# Patient Record
Sex: Female | Born: 1960 | Race: White | Hispanic: No | Marital: Married | Smoking: Never smoker
Health system: Southern US, Community
[De-identification: ages and names within clinical notes are randomized; demographics above are authoritative.]

## PROBLEM LIST (undated history)

## (undated) DIAGNOSIS — J45909 Unspecified asthma, uncomplicated: Secondary | ICD-10-CM

## (undated) DIAGNOSIS — E119 Type 2 diabetes mellitus without complications: Secondary | ICD-10-CM

---

## 1998-01-12 ENCOUNTER — Ambulatory Visit (HOSPITAL_BASED_OUTPATIENT_CLINIC_OR_DEPARTMENT_OTHER): Admission: RE | Admit: 1998-01-12 | Discharge: 1998-01-12 | Payer: Self-pay | Admitting: Orthopedic Surgery

## 1998-02-11 ENCOUNTER — Ambulatory Visit: Admission: RE | Admit: 1998-02-11 | Discharge: 1998-02-11 | Payer: Self-pay | Admitting: *Deleted

## 1998-02-21 ENCOUNTER — Other Ambulatory Visit: Admission: RE | Admit: 1998-02-21 | Discharge: 1998-02-21 | Payer: Self-pay | Admitting: Obstetrics and Gynecology

## 1998-04-26 ENCOUNTER — Other Ambulatory Visit: Admission: RE | Admit: 1998-04-26 | Discharge: 1998-04-26 | Payer: Self-pay | Admitting: Obstetrics and Gynecology

## 1998-09-18 ENCOUNTER — Other Ambulatory Visit: Admission: RE | Admit: 1998-09-18 | Discharge: 1998-09-18 | Payer: Self-pay | Admitting: Obstetrics and Gynecology

## 1998-09-25 ENCOUNTER — Observation Stay (HOSPITAL_COMMUNITY): Admission: RE | Admit: 1998-09-25 | Discharge: 1998-09-26 | Payer: Self-pay | Admitting: Obstetrics and Gynecology

## 1999-03-30 ENCOUNTER — Ambulatory Visit (HOSPITAL_COMMUNITY): Admission: RE | Admit: 1999-03-30 | Discharge: 1999-03-30 | Payer: Self-pay | Admitting: Obstetrics and Gynecology

## 1999-03-30 ENCOUNTER — Encounter: Payer: Self-pay | Admitting: Obstetrics and Gynecology

## 1999-08-02 ENCOUNTER — Emergency Department (HOSPITAL_COMMUNITY): Admission: EM | Admit: 1999-08-02 | Discharge: 1999-08-02 | Payer: Self-pay | Admitting: Emergency Medicine

## 1999-08-10 ENCOUNTER — Encounter: Payer: Self-pay | Admitting: Neurosurgery

## 1999-08-10 ENCOUNTER — Encounter: Admission: RE | Admit: 1999-08-10 | Discharge: 1999-08-10 | Payer: Self-pay | Admitting: Neurosurgery

## 1999-08-21 ENCOUNTER — Other Ambulatory Visit: Admission: RE | Admit: 1999-08-21 | Discharge: 1999-08-21 | Payer: Self-pay | Admitting: Obstetrics and Gynecology

## 1999-08-29 ENCOUNTER — Encounter: Admission: RE | Admit: 1999-08-29 | Discharge: 1999-11-13 | Payer: Self-pay | Admitting: Orthopedic Surgery

## 2000-01-10 ENCOUNTER — Encounter: Payer: Self-pay | Admitting: Rheumatology

## 2000-01-10 ENCOUNTER — Ambulatory Visit (HOSPITAL_COMMUNITY): Admission: RE | Admit: 2000-01-10 | Discharge: 2000-01-10 | Payer: Self-pay | Admitting: Rheumatology

## 2000-01-16 ENCOUNTER — Encounter: Admission: RE | Admit: 2000-01-16 | Discharge: 2000-01-16 | Payer: Self-pay | Admitting: Infectious Diseases

## 2000-01-16 ENCOUNTER — Encounter: Payer: Self-pay | Admitting: Internal Medicine

## 2000-01-16 ENCOUNTER — Ambulatory Visit (HOSPITAL_COMMUNITY): Admission: RE | Admit: 2000-01-16 | Discharge: 2000-01-16 | Payer: Self-pay | Admitting: Internal Medicine

## 2000-01-30 ENCOUNTER — Encounter: Admission: RE | Admit: 2000-01-30 | Discharge: 2000-01-30 | Payer: Self-pay | Admitting: Infectious Diseases

## 2000-01-31 ENCOUNTER — Encounter: Payer: Self-pay | Admitting: Infectious Diseases

## 2000-01-31 ENCOUNTER — Ambulatory Visit (HOSPITAL_COMMUNITY): Admission: RE | Admit: 2000-01-31 | Discharge: 2000-01-31 | Payer: Self-pay | Admitting: Infectious Diseases

## 2000-02-27 ENCOUNTER — Encounter: Payer: Self-pay | Admitting: Gastroenterology

## 2000-02-27 ENCOUNTER — Encounter (INDEPENDENT_AMBULATORY_CARE_PROVIDER_SITE_OTHER): Payer: Self-pay | Admitting: Specialist

## 2000-02-27 ENCOUNTER — Ambulatory Visit (HOSPITAL_COMMUNITY): Admission: RE | Admit: 2000-02-27 | Discharge: 2000-02-27 | Payer: Self-pay | Admitting: Gastroenterology

## 2000-05-06 ENCOUNTER — Encounter: Payer: Self-pay | Admitting: Orthopedic Surgery

## 2000-05-06 ENCOUNTER — Encounter: Admission: RE | Admit: 2000-05-06 | Discharge: 2000-05-06 | Payer: Self-pay | Admitting: Orthopedic Surgery

## 2000-06-25 ENCOUNTER — Emergency Department (HOSPITAL_COMMUNITY): Admission: EM | Admit: 2000-06-25 | Discharge: 2000-06-25 | Payer: Self-pay | Admitting: Emergency Medicine

## 2000-09-22 ENCOUNTER — Other Ambulatory Visit: Admission: RE | Admit: 2000-09-22 | Discharge: 2000-09-22 | Payer: Self-pay | Admitting: Obstetrics and Gynecology

## 2000-10-22 ENCOUNTER — Ambulatory Visit (HOSPITAL_COMMUNITY): Admission: RE | Admit: 2000-10-22 | Discharge: 2000-10-22 | Payer: Self-pay | Admitting: Orthopedic Surgery

## 2000-10-22 ENCOUNTER — Encounter: Payer: Self-pay | Admitting: Orthopedic Surgery

## 2000-10-31 ENCOUNTER — Encounter: Payer: Self-pay | Admitting: Orthopedic Surgery

## 2000-10-31 ENCOUNTER — Ambulatory Visit (HOSPITAL_COMMUNITY): Admission: RE | Admit: 2000-10-31 | Discharge: 2000-10-31 | Payer: Self-pay | Admitting: Orthopedic Surgery

## 2000-11-12 ENCOUNTER — Encounter (INDEPENDENT_AMBULATORY_CARE_PROVIDER_SITE_OTHER): Payer: Self-pay | Admitting: *Deleted

## 2000-11-12 ENCOUNTER — Ambulatory Visit (HOSPITAL_BASED_OUTPATIENT_CLINIC_OR_DEPARTMENT_OTHER): Admission: RE | Admit: 2000-11-12 | Discharge: 2000-11-12 | Payer: Self-pay | Admitting: *Deleted

## 2000-11-19 ENCOUNTER — Ambulatory Visit (HOSPITAL_COMMUNITY): Admission: RE | Admit: 2000-11-19 | Discharge: 2000-11-19 | Payer: Self-pay | Admitting: Gastroenterology

## 2000-11-19 ENCOUNTER — Encounter: Payer: Self-pay | Admitting: Gastroenterology

## 2000-11-27 ENCOUNTER — Encounter: Admission: RE | Admit: 2000-11-27 | Discharge: 2000-11-27 | Payer: Self-pay | Admitting: Infectious Diseases

## 2000-12-11 ENCOUNTER — Encounter (INDEPENDENT_AMBULATORY_CARE_PROVIDER_SITE_OTHER): Payer: Self-pay | Admitting: Specialist

## 2000-12-11 ENCOUNTER — Observation Stay (HOSPITAL_COMMUNITY): Admission: RE | Admit: 2000-12-11 | Discharge: 2000-12-13 | Payer: Self-pay | Admitting: *Deleted

## 2001-03-08 ENCOUNTER — Emergency Department (HOSPITAL_COMMUNITY): Admission: EM | Admit: 2001-03-08 | Discharge: 2001-03-08 | Payer: Self-pay | Admitting: Emergency Medicine

## 2001-03-23 ENCOUNTER — Encounter: Admission: RE | Admit: 2001-03-23 | Discharge: 2001-03-23 | Payer: Self-pay | Admitting: Orthopedic Surgery

## 2001-03-23 ENCOUNTER — Encounter: Payer: Self-pay | Admitting: Orthopedic Surgery

## 2001-03-26 ENCOUNTER — Encounter: Admission: RE | Admit: 2001-03-26 | Discharge: 2001-03-26 | Payer: Self-pay | Admitting: Orthopedic Surgery

## 2001-03-26 ENCOUNTER — Encounter: Payer: Self-pay | Admitting: Orthopedic Surgery

## 2001-05-04 ENCOUNTER — Encounter: Admission: RE | Admit: 2001-05-04 | Discharge: 2001-05-04 | Payer: Self-pay | Admitting: Orthopedic Surgery

## 2001-05-04 ENCOUNTER — Encounter: Payer: Self-pay | Admitting: Orthopedic Surgery

## 2001-05-05 ENCOUNTER — Ambulatory Visit (HOSPITAL_COMMUNITY): Admission: RE | Admit: 2001-05-05 | Discharge: 2001-05-05 | Payer: Self-pay | Admitting: Gastroenterology

## 2001-06-10 ENCOUNTER — Ambulatory Visit: Admission: RE | Admit: 2001-06-10 | Discharge: 2001-06-10 | Payer: Self-pay | Admitting: Neurosurgery

## 2001-06-10 ENCOUNTER — Encounter: Payer: Self-pay | Admitting: Neurosurgery

## 2001-06-12 ENCOUNTER — Encounter: Payer: Self-pay | Admitting: Neurosurgery

## 2001-06-12 ENCOUNTER — Encounter: Admission: RE | Admit: 2001-06-12 | Discharge: 2001-06-12 | Payer: Self-pay | Admitting: Neurosurgery

## 2001-06-17 ENCOUNTER — Inpatient Hospital Stay (HOSPITAL_COMMUNITY): Admission: RE | Admit: 2001-06-17 | Discharge: 2001-06-20 | Payer: Self-pay | Admitting: Neurosurgery

## 2001-06-17 ENCOUNTER — Encounter: Payer: Self-pay | Admitting: Neurosurgery

## 2001-07-13 ENCOUNTER — Encounter: Admission: RE | Admit: 2001-07-13 | Discharge: 2001-07-13 | Payer: Self-pay | Admitting: Neurosurgery

## 2001-07-13 ENCOUNTER — Encounter: Payer: Self-pay | Admitting: Neurosurgery

## 2001-07-22 ENCOUNTER — Other Ambulatory Visit: Admission: RE | Admit: 2001-07-22 | Discharge: 2001-07-22 | Payer: Self-pay | Admitting: Obstetrics and Gynecology

## 2001-08-06 ENCOUNTER — Encounter: Admission: RE | Admit: 2001-08-06 | Discharge: 2001-08-06 | Payer: Self-pay | Admitting: Infectious Diseases

## 2001-08-19 ENCOUNTER — Encounter: Admission: RE | Admit: 2001-08-19 | Discharge: 2001-08-19 | Payer: Self-pay | Admitting: Neurosurgery

## 2001-08-19 ENCOUNTER — Encounter: Payer: Self-pay | Admitting: Neurosurgery

## 2001-08-21 ENCOUNTER — Encounter: Admission: RE | Admit: 2001-08-21 | Discharge: 2001-08-21 | Payer: Self-pay | Admitting: Infectious Diseases

## 2001-09-16 ENCOUNTER — Encounter: Admission: RE | Admit: 2001-09-16 | Discharge: 2001-09-16 | Payer: Self-pay | Admitting: Internal Medicine

## 2001-09-18 ENCOUNTER — Encounter: Admission: RE | Admit: 2001-09-18 | Discharge: 2001-09-18 | Payer: Self-pay | Admitting: Infectious Diseases

## 2001-09-27 ENCOUNTER — Encounter: Admission: RE | Admit: 2001-09-27 | Discharge: 2001-09-27 | Payer: Self-pay | Admitting: Orthopaedic Surgery

## 2001-09-27 ENCOUNTER — Encounter: Payer: Self-pay | Admitting: Orthopaedic Surgery

## 2001-10-12 ENCOUNTER — Encounter: Payer: Self-pay | Admitting: Orthopedic Surgery

## 2001-10-12 ENCOUNTER — Encounter: Admission: RE | Admit: 2001-10-12 | Discharge: 2001-10-12 | Payer: Self-pay | Admitting: Orthopedic Surgery

## 2001-11-20 ENCOUNTER — Encounter: Payer: Self-pay | Admitting: Orthopedic Surgery

## 2001-11-20 ENCOUNTER — Encounter: Admission: RE | Admit: 2001-11-20 | Discharge: 2001-11-20 | Payer: Self-pay | Admitting: Orthopedic Surgery

## 2001-12-08 ENCOUNTER — Encounter: Payer: Self-pay | Admitting: Orthopedic Surgery

## 2001-12-08 ENCOUNTER — Encounter: Admission: RE | Admit: 2001-12-08 | Discharge: 2001-12-08 | Payer: Self-pay | Admitting: Orthopedic Surgery

## 2001-12-22 ENCOUNTER — Encounter: Admission: RE | Admit: 2001-12-22 | Discharge: 2001-12-22 | Payer: Self-pay | Admitting: Orthopedic Surgery

## 2001-12-22 ENCOUNTER — Encounter: Payer: Self-pay | Admitting: Orthopedic Surgery

## 2002-01-05 ENCOUNTER — Encounter: Payer: Self-pay | Admitting: Orthopedic Surgery

## 2002-01-05 ENCOUNTER — Encounter: Admission: RE | Admit: 2002-01-05 | Discharge: 2002-01-05 | Payer: Self-pay | Admitting: Orthopedic Surgery

## 2002-01-18 ENCOUNTER — Encounter: Payer: Self-pay | Admitting: Emergency Medicine

## 2002-01-18 ENCOUNTER — Emergency Department (HOSPITAL_COMMUNITY): Admission: EM | Admit: 2002-01-18 | Discharge: 2002-01-18 | Payer: Self-pay | Admitting: Emergency Medicine

## 2002-04-07 ENCOUNTER — Encounter: Payer: Self-pay | Admitting: Obstetrics and Gynecology

## 2002-04-07 ENCOUNTER — Encounter: Admission: RE | Admit: 2002-04-07 | Discharge: 2002-04-07 | Payer: Self-pay | Admitting: Obstetrics and Gynecology

## 2002-05-26 ENCOUNTER — Encounter: Admission: RE | Admit: 2002-05-26 | Discharge: 2002-05-26 | Payer: Self-pay | Admitting: Orthopedic Surgery

## 2002-05-26 ENCOUNTER — Encounter: Payer: Self-pay | Admitting: Orthopedic Surgery

## 2002-06-15 ENCOUNTER — Encounter: Admission: RE | Admit: 2002-06-15 | Discharge: 2002-06-15 | Payer: Self-pay | Admitting: Internal Medicine

## 2002-06-15 ENCOUNTER — Encounter: Payer: Self-pay | Admitting: Internal Medicine

## 2002-07-09 ENCOUNTER — Encounter: Payer: Self-pay | Admitting: Orthopedic Surgery

## 2002-07-09 ENCOUNTER — Encounter: Admission: RE | Admit: 2002-07-09 | Discharge: 2002-07-09 | Payer: Self-pay | Admitting: Orthopedic Surgery

## 2002-07-23 ENCOUNTER — Encounter: Admission: RE | Admit: 2002-07-23 | Discharge: 2002-07-23 | Payer: Self-pay | Admitting: Orthopedic Surgery

## 2002-07-23 ENCOUNTER — Encounter: Payer: Self-pay | Admitting: Orthopedic Surgery

## 2002-08-06 ENCOUNTER — Encounter: Admission: RE | Admit: 2002-08-06 | Discharge: 2002-08-06 | Payer: Self-pay | Admitting: Orthopedic Surgery

## 2002-08-06 ENCOUNTER — Encounter: Payer: Self-pay | Admitting: Orthopedic Surgery

## 2002-08-11 ENCOUNTER — Other Ambulatory Visit: Admission: RE | Admit: 2002-08-11 | Discharge: 2002-08-11 | Payer: Self-pay | Admitting: Obstetrics and Gynecology

## 2002-09-09 ENCOUNTER — Encounter: Payer: Self-pay | Admitting: *Deleted

## 2002-09-09 ENCOUNTER — Encounter: Admission: RE | Admit: 2002-09-09 | Discharge: 2002-09-09 | Payer: Self-pay | Admitting: *Deleted

## 2002-09-16 ENCOUNTER — Encounter: Payer: Self-pay | Admitting: Internal Medicine

## 2002-09-16 DIAGNOSIS — D126 Benign neoplasm of colon, unspecified: Secondary | ICD-10-CM

## 2002-09-16 DIAGNOSIS — K644 Residual hemorrhoidal skin tags: Secondary | ICD-10-CM | POA: Insufficient documentation

## 2002-09-18 ENCOUNTER — Encounter: Admission: RE | Admit: 2002-09-18 | Discharge: 2002-09-18 | Payer: Self-pay | Admitting: Orthopedic Surgery

## 2002-09-18 ENCOUNTER — Encounter: Payer: Self-pay | Admitting: Orthopedic Surgery

## 2002-11-24 ENCOUNTER — Encounter: Payer: Self-pay | Admitting: Family Medicine

## 2002-11-24 ENCOUNTER — Encounter: Admission: RE | Admit: 2002-11-24 | Discharge: 2002-11-24 | Payer: Self-pay | Admitting: Family Medicine

## 2002-12-15 ENCOUNTER — Encounter: Payer: Self-pay | Admitting: Neurosurgery

## 2002-12-15 ENCOUNTER — Ambulatory Visit (HOSPITAL_COMMUNITY): Admission: RE | Admit: 2002-12-15 | Discharge: 2002-12-17 | Payer: Self-pay | Admitting: Neurosurgery

## 2003-02-04 ENCOUNTER — Ambulatory Visit (HOSPITAL_BASED_OUTPATIENT_CLINIC_OR_DEPARTMENT_OTHER): Admission: RE | Admit: 2003-02-04 | Discharge: 2003-02-04 | Payer: Self-pay | Admitting: Urology

## 2003-06-09 ENCOUNTER — Ambulatory Visit (HOSPITAL_BASED_OUTPATIENT_CLINIC_OR_DEPARTMENT_OTHER): Admission: RE | Admit: 2003-06-09 | Discharge: 2003-06-09 | Payer: Self-pay | Admitting: Urology

## 2003-06-29 ENCOUNTER — Encounter: Admission: RE | Admit: 2003-06-29 | Discharge: 2003-06-29 | Payer: Self-pay | Admitting: Obstetrics and Gynecology

## 2003-06-29 ENCOUNTER — Encounter: Payer: Self-pay | Admitting: Obstetrics and Gynecology

## 2003-08-15 ENCOUNTER — Other Ambulatory Visit: Admission: RE | Admit: 2003-08-15 | Discharge: 2003-08-15 | Payer: Self-pay | Admitting: Obstetrics and Gynecology

## 2004-01-20 ENCOUNTER — Encounter: Admission: RE | Admit: 2004-01-20 | Discharge: 2004-01-20 | Payer: Self-pay | Admitting: Orthopedic Surgery

## 2004-05-11 ENCOUNTER — Encounter: Admission: RE | Admit: 2004-05-11 | Discharge: 2004-05-11 | Payer: Self-pay | Admitting: Obstetrics and Gynecology

## 2004-07-06 ENCOUNTER — Encounter: Admission: RE | Admit: 2004-07-06 | Discharge: 2004-07-06 | Payer: Self-pay | Admitting: Orthopedic Surgery

## 2004-07-27 ENCOUNTER — Inpatient Hospital Stay (HOSPITAL_COMMUNITY): Admission: RE | Admit: 2004-07-27 | Discharge: 2004-08-02 | Payer: Self-pay | Admitting: Neurosurgery

## 2004-08-18 ENCOUNTER — Emergency Department (HOSPITAL_COMMUNITY): Admission: EM | Admit: 2004-08-18 | Discharge: 2004-08-18 | Payer: Self-pay | Admitting: Emergency Medicine

## 2004-10-04 ENCOUNTER — Ambulatory Visit: Payer: Self-pay | Admitting: Pulmonary Disease

## 2004-11-20 ENCOUNTER — Ambulatory Visit: Payer: Self-pay | Admitting: Family Medicine

## 2004-12-14 ENCOUNTER — Encounter: Payer: Self-pay | Admitting: Pulmonary Disease

## 2005-01-14 ENCOUNTER — Encounter: Admission: RE | Admit: 2005-01-14 | Discharge: 2005-01-24 | Payer: Self-pay | Admitting: Orthopedic Surgery

## 2005-01-14 ENCOUNTER — Ambulatory Visit: Payer: Self-pay | Admitting: Internal Medicine

## 2005-01-25 ENCOUNTER — Ambulatory Visit: Payer: Self-pay | Admitting: Family Medicine

## 2005-02-05 ENCOUNTER — Ambulatory Visit: Payer: Self-pay | Admitting: Cardiology

## 2005-02-07 ENCOUNTER — Ambulatory Visit: Payer: Self-pay | Admitting: Pulmonary Disease

## 2005-02-08 ENCOUNTER — Ambulatory Visit: Payer: Self-pay | Admitting: Cardiovascular Disease

## 2005-02-14 ENCOUNTER — Ambulatory Visit: Payer: Self-pay | Admitting: Cardiovascular Disease

## 2005-02-22 ENCOUNTER — Ambulatory Visit (HOSPITAL_COMMUNITY): Admission: RE | Admit: 2005-02-22 | Discharge: 2005-02-22 | Payer: Self-pay | Admitting: Neurosurgery

## 2005-04-11 ENCOUNTER — Ambulatory Visit: Payer: Self-pay | Admitting: Pulmonary Disease

## 2005-07-11 ENCOUNTER — Ambulatory Visit: Payer: Self-pay | Admitting: Internal Medicine

## 2005-07-11 ENCOUNTER — Other Ambulatory Visit: Admission: RE | Admit: 2005-07-11 | Discharge: 2005-07-11 | Payer: Self-pay | Admitting: Obstetrics and Gynecology

## 2005-08-16 IMAGING — RF DG LUMBAR SPINE 2-3V
1 series · 2 of 2 positions shown · non-contrast
Comparison: none

CLINICAL DATA: L5-S1 diskectomy and fusion.
 LATERAL LUMBAR SPINE 
 Only a single C-arm spot film of the lumbar spine was returned for interpretation from the operating room in the lateral projection.  Transpedicular screws are present for fixation but resolution is very limited.  Follow-up films are recommended.

[Series 1: run · 2 of 2 slices shown]
[im 1/2]
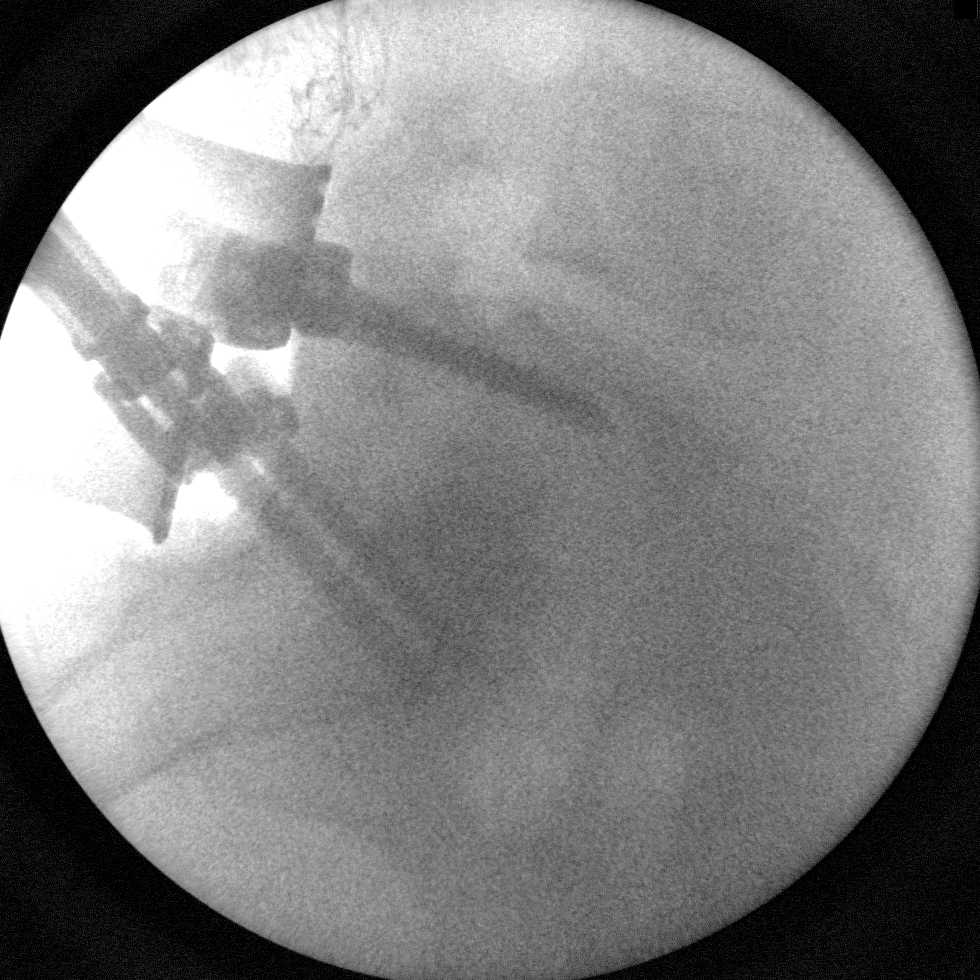
[im 2/2]
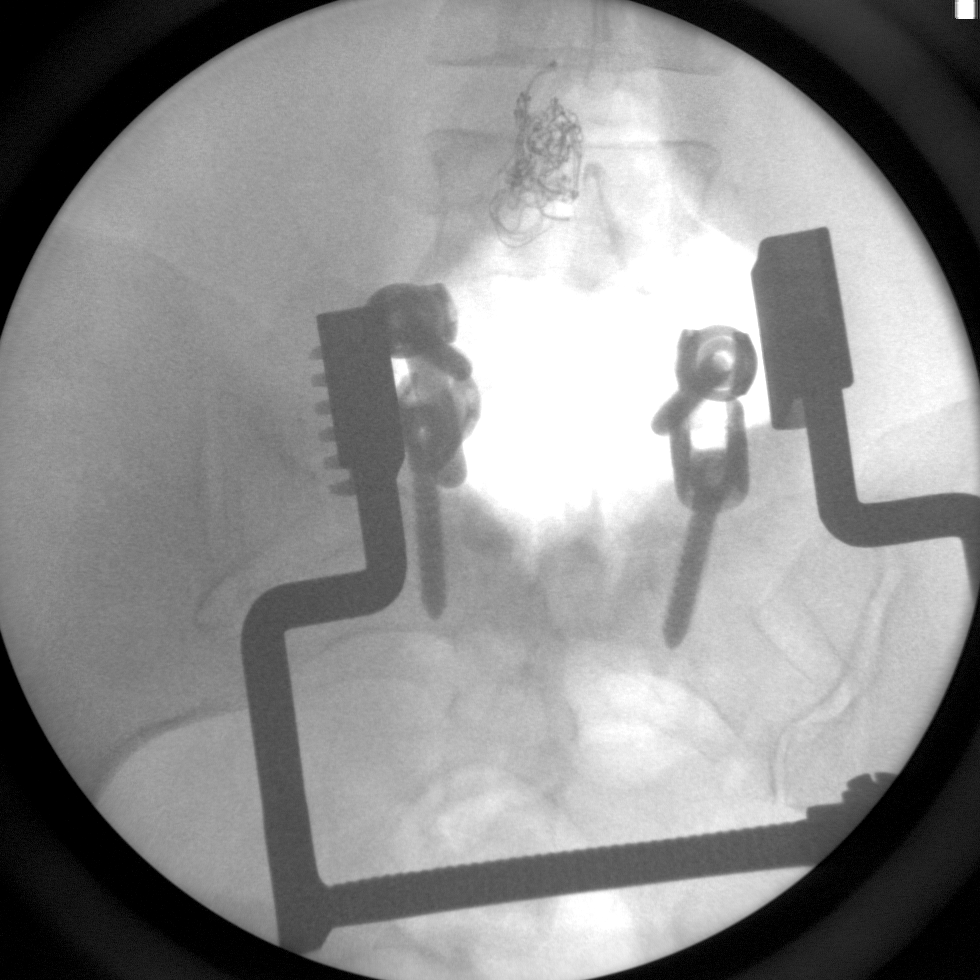

[2 of 2 positions shown; findings below may reference images not displayed]

IMPRESSION: Transpedicular screws are present for apparent L5-S1 fusion.  Suggest follow-up films with diminished resolution on the present film.

## 2005-12-04 ENCOUNTER — Ambulatory Visit: Payer: Self-pay | Admitting: Internal Medicine

## 2005-12-23 ENCOUNTER — Ambulatory Visit: Payer: Self-pay | Admitting: Pulmonary Disease

## 2005-12-30 ENCOUNTER — Ambulatory Visit: Payer: Self-pay | Admitting: Pulmonary Disease

## 2006-03-17 ENCOUNTER — Encounter: Admission: RE | Admit: 2006-03-17 | Discharge: 2006-03-17 | Payer: Self-pay | Admitting: Orthopedic Surgery

## 2006-03-18 ENCOUNTER — Ambulatory Visit: Payer: Self-pay | Admitting: Internal Medicine

## 2006-04-01 ENCOUNTER — Ambulatory Visit (HOSPITAL_COMMUNITY): Admission: RE | Admit: 2006-04-01 | Discharge: 2006-04-01 | Payer: Self-pay | Admitting: Internal Medicine

## 2006-04-22 ENCOUNTER — Encounter (INDEPENDENT_AMBULATORY_CARE_PROVIDER_SITE_OTHER): Payer: Self-pay | Admitting: Specialist

## 2006-04-22 ENCOUNTER — Ambulatory Visit (HOSPITAL_COMMUNITY): Admission: RE | Admit: 2006-04-22 | Discharge: 2006-04-22 | Payer: Self-pay | Admitting: Internal Medicine

## 2006-04-22 ENCOUNTER — Encounter: Payer: Self-pay | Admitting: Internal Medicine

## 2006-04-22 DIAGNOSIS — K2990 Gastroduodenitis, unspecified, without bleeding: Secondary | ICD-10-CM

## 2006-04-22 DIAGNOSIS — K2970 Gastritis, unspecified, without bleeding: Secondary | ICD-10-CM | POA: Insufficient documentation

## 2006-04-22 DIAGNOSIS — K449 Diaphragmatic hernia without obstruction or gangrene: Secondary | ICD-10-CM | POA: Insufficient documentation

## 2006-04-24 ENCOUNTER — Ambulatory Visit: Payer: Self-pay | Admitting: Internal Medicine

## 2006-05-02 ENCOUNTER — Ambulatory Visit: Payer: Self-pay | Admitting: Pulmonary Disease

## 2006-05-19 ENCOUNTER — Ambulatory Visit: Payer: Self-pay | Admitting: Internal Medicine

## 2006-06-13 ENCOUNTER — Ambulatory Visit: Payer: Self-pay | Admitting: Cardiovascular Disease

## 2006-06-17 ENCOUNTER — Ambulatory Visit: Payer: Self-pay | Admitting: Pulmonary Disease

## 2006-07-07 ENCOUNTER — Ambulatory Visit: Payer: Self-pay | Admitting: Pulmonary Disease

## 2006-07-22 ENCOUNTER — Ambulatory Visit: Payer: Self-pay | Admitting: Pulmonary Disease

## 2006-08-18 ENCOUNTER — Ambulatory Visit: Payer: Self-pay | Admitting: Cardiovascular Disease

## 2006-08-18 ENCOUNTER — Ambulatory Visit: Payer: Self-pay | Admitting: Pulmonary Disease

## 2006-10-03 ENCOUNTER — Ambulatory Visit (HOSPITAL_COMMUNITY): Admission: RE | Admit: 2006-10-03 | Discharge: 2006-10-03 | Payer: Self-pay | Admitting: Urology

## 2007-02-23 ENCOUNTER — Ambulatory Visit: Payer: Self-pay | Admitting: Pulmonary Disease

## 2007-02-24 LAB — CONVERTED CEMR LAB
CO2: 30 meq/L (ref 19–32)
Glucose, Bld: 90 mg/dL (ref 70–99)
Potassium: 4.5 meq/L (ref 3.5–5.1)

## 2007-03-03 ENCOUNTER — Ambulatory Visit (HOSPITAL_COMMUNITY): Admission: RE | Admit: 2007-03-03 | Discharge: 2007-03-03 | Payer: Self-pay | Admitting: Pulmonary Disease

## 2007-03-10 ENCOUNTER — Ambulatory Visit: Payer: Self-pay | Admitting: Cardiovascular Disease

## 2007-04-09 ENCOUNTER — Ambulatory Visit: Payer: Self-pay | Admitting: Pulmonary Disease

## 2007-05-29 ENCOUNTER — Ambulatory Visit: Payer: Self-pay | Admitting: Cardiovascular Disease

## 2007-05-29 LAB — CONVERTED CEMR LAB
BUN: 3 mg/dL — ABNORMAL LOW (ref 6–23)
CO2: 29 meq/L (ref 19–32)
Calcium: 9.4 mg/dL (ref 8.4–10.5)
Chloride: 101 meq/L (ref 96–112)
Creatinine, Ser: 0.7 mg/dL (ref 0.4–1.2)
GFR calc Af Amer: 116 mL/min
GFR calc non Af Amer: 96 mL/min
Glucose, Bld: 107 mg/dL — ABNORMAL HIGH (ref 70–99)
Potassium: 3.4 meq/L — ABNORMAL LOW (ref 3.5–5.1)
Sodium: 139 meq/L (ref 135–145)

## 2007-06-15 ENCOUNTER — Ambulatory Visit (HOSPITAL_COMMUNITY): Admission: RE | Admit: 2007-06-15 | Discharge: 2007-06-15 | Payer: Self-pay | Admitting: Cardiovascular Disease

## 2007-06-22 ENCOUNTER — Ambulatory Visit: Payer: Self-pay | Admitting: Cardiovascular Disease

## 2007-06-22 ENCOUNTER — Encounter: Payer: Self-pay | Admitting: Cardiovascular Disease

## 2007-06-22 LAB — CONVERTED CEMR LAB
ALT: 26 units/L (ref 0–35)
AST: 26 units/L (ref 0–37)
Albumin: 3.2 g/dL — ABNORMAL LOW (ref 3.5–5.2)
Alkaline Phosphatase: 98 units/L (ref 39–117)
Basophils Relative: 0 % (ref 0.0–1.0)
Free T4: 0.4 ng/dL — ABNORMAL LOW (ref 0.6–1.6)
Monocytes Relative: 2.8 % — ABNORMAL LOW (ref 3.0–11.0)
Platelets: 300 10*3/uL (ref 150–400)
RBC: 4.15 M/uL (ref 3.87–5.11)
RDW: 13.6 % (ref 11.5–14.6)
Total Bilirubin: 0.5 mg/dL (ref 0.3–1.2)

## 2007-06-29 ENCOUNTER — Ambulatory Visit: Payer: Self-pay | Admitting: Cardiovascular Disease

## 2007-07-01 ENCOUNTER — Ambulatory Visit: Payer: Self-pay | Admitting: Pulmonary Disease

## 2007-07-17 ENCOUNTER — Emergency Department (HOSPITAL_COMMUNITY): Admission: EM | Admit: 2007-07-17 | Discharge: 2007-07-17 | Payer: Self-pay | Admitting: Emergency Medicine

## 2007-07-21 DIAGNOSIS — K219 Gastro-esophageal reflux disease without esophagitis: Secondary | ICD-10-CM

## 2007-07-21 DIAGNOSIS — R059 Cough, unspecified: Secondary | ICD-10-CM | POA: Insufficient documentation

## 2007-07-21 DIAGNOSIS — E7800 Pure hypercholesterolemia, unspecified: Secondary | ICD-10-CM

## 2007-07-21 DIAGNOSIS — G4733 Obstructive sleep apnea (adult) (pediatric): Secondary | ICD-10-CM

## 2007-07-21 DIAGNOSIS — R300 Dysuria: Secondary | ICD-10-CM | POA: Insufficient documentation

## 2007-07-21 DIAGNOSIS — R0602 Shortness of breath: Secondary | ICD-10-CM

## 2007-07-21 DIAGNOSIS — I10 Essential (primary) hypertension: Secondary | ICD-10-CM | POA: Insufficient documentation

## 2007-07-21 DIAGNOSIS — J019 Acute sinusitis, unspecified: Secondary | ICD-10-CM

## 2007-07-21 DIAGNOSIS — R609 Edema, unspecified: Secondary | ICD-10-CM

## 2007-07-21 DIAGNOSIS — R05 Cough: Secondary | ICD-10-CM

## 2007-07-21 DIAGNOSIS — R259 Unspecified abnormal involuntary movements: Secondary | ICD-10-CM | POA: Insufficient documentation

## 2007-07-25 DIAGNOSIS — J383 Other diseases of vocal cords: Secondary | ICD-10-CM

## 2007-08-24 ENCOUNTER — Telehealth: Payer: Self-pay | Admitting: Pulmonary Disease

## 2007-09-02 ENCOUNTER — Ambulatory Visit: Payer: Self-pay | Admitting: Pulmonary Disease

## 2007-09-09 ENCOUNTER — Ambulatory Visit: Payer: Self-pay | Admitting: Internal Medicine

## 2007-10-02 ENCOUNTER — Telehealth (INDEPENDENT_AMBULATORY_CARE_PROVIDER_SITE_OTHER): Payer: Self-pay | Admitting: *Deleted

## 2007-10-07 DIAGNOSIS — IMO0001 Reserved for inherently not codable concepts without codable children: Secondary | ICD-10-CM

## 2007-10-07 DIAGNOSIS — R498 Other voice and resonance disorders: Secondary | ICD-10-CM

## 2007-10-07 DIAGNOSIS — G8929 Other chronic pain: Secondary | ICD-10-CM

## 2007-10-07 DIAGNOSIS — K7689 Other specified diseases of liver: Secondary | ICD-10-CM

## 2007-10-07 DIAGNOSIS — E119 Type 2 diabetes mellitus without complications: Secondary | ICD-10-CM | POA: Insufficient documentation

## 2007-10-07 DIAGNOSIS — K589 Irritable bowel syndrome without diarrhea: Secondary | ICD-10-CM

## 2007-10-07 DIAGNOSIS — F329 Major depressive disorder, single episode, unspecified: Secondary | ICD-10-CM

## 2007-10-07 DIAGNOSIS — K439 Ventral hernia without obstruction or gangrene: Secondary | ICD-10-CM | POA: Insufficient documentation

## 2007-10-07 DIAGNOSIS — R32 Unspecified urinary incontinence: Secondary | ICD-10-CM

## 2007-10-07 DIAGNOSIS — R002 Palpitations: Secondary | ICD-10-CM

## 2007-10-07 DIAGNOSIS — R0789 Other chest pain: Secondary | ICD-10-CM | POA: Insufficient documentation

## 2007-10-07 DIAGNOSIS — F3289 Other specified depressive episodes: Secondary | ICD-10-CM | POA: Insufficient documentation

## 2007-10-07 DIAGNOSIS — R1314 Dysphagia, pharyngoesophageal phase: Secondary | ICD-10-CM

## 2007-10-07 DIAGNOSIS — G43909 Migraine, unspecified, not intractable, without status migrainosus: Secondary | ICD-10-CM | POA: Insufficient documentation

## 2007-10-28 ENCOUNTER — Telehealth: Payer: Self-pay | Admitting: Pulmonary Disease

## 2007-10-29 ENCOUNTER — Telehealth (INDEPENDENT_AMBULATORY_CARE_PROVIDER_SITE_OTHER): Payer: Self-pay | Admitting: *Deleted

## 2007-11-01 ENCOUNTER — Encounter: Admission: RE | Admit: 2007-11-01 | Discharge: 2007-11-01 | Payer: Self-pay | Admitting: Orthopedic Surgery

## 2007-11-20 ENCOUNTER — Ambulatory Visit: Payer: Self-pay | Admitting: Licensed Clinical Social Worker

## 2007-11-27 ENCOUNTER — Ambulatory Visit: Payer: Self-pay | Admitting: Licensed Clinical Social Worker

## 2007-12-09 ENCOUNTER — Inpatient Hospital Stay (HOSPITAL_COMMUNITY): Admission: AD | Admit: 2007-12-09 | Discharge: 2007-12-10 | Payer: Self-pay | Admitting: Orthopaedic Surgery

## 2007-12-31 ENCOUNTER — Encounter: Admission: RE | Admit: 2007-12-31 | Discharge: 2008-03-10 | Payer: Self-pay | Admitting: Orthopaedic Surgery

## 2008-01-01 ENCOUNTER — Ambulatory Visit: Payer: Self-pay | Admitting: Licensed Clinical Social Worker

## 2008-01-01 ENCOUNTER — Encounter: Payer: Self-pay | Admitting: Pulmonary Disease

## 2008-01-10 ENCOUNTER — Encounter: Admission: RE | Admit: 2008-01-10 | Discharge: 2008-01-10 | Payer: Self-pay | Admitting: Orthopaedic Surgery

## 2008-01-23 ENCOUNTER — Encounter: Admission: RE | Admit: 2008-01-23 | Discharge: 2008-01-23 | Payer: Self-pay | Admitting: Neurosurgery

## 2008-01-26 ENCOUNTER — Encounter: Admission: RE | Admit: 2008-01-26 | Discharge: 2008-01-26 | Payer: Self-pay | Admitting: Neurosurgery

## 2008-02-10 ENCOUNTER — Inpatient Hospital Stay (HOSPITAL_COMMUNITY): Admission: RE | Admit: 2008-02-10 | Discharge: 2008-02-11 | Payer: Self-pay | Admitting: Orthopaedic Surgery

## 2008-03-25 ENCOUNTER — Telehealth: Payer: Self-pay | Admitting: Pulmonary Disease

## 2008-03-25 ENCOUNTER — Encounter: Payer: Self-pay | Admitting: Pulmonary Disease

## 2008-05-31 ENCOUNTER — Telehealth: Payer: Self-pay | Admitting: Internal Medicine

## 2008-06-01 ENCOUNTER — Encounter: Admission: RE | Admit: 2008-06-01 | Discharge: 2008-06-01 | Payer: Self-pay | Admitting: Obstetrics and Gynecology

## 2008-06-01 ENCOUNTER — Ambulatory Visit: Payer: Self-pay | Admitting: Cardiovascular Disease

## 2008-06-06 ENCOUNTER — Encounter: Admission: RE | Admit: 2008-06-06 | Discharge: 2008-06-06 | Payer: Self-pay | Admitting: Obstetrics and Gynecology

## 2008-06-07 ENCOUNTER — Ambulatory Visit: Payer: Self-pay | Admitting: Pulmonary Disease

## 2008-06-07 ENCOUNTER — Telehealth (INDEPENDENT_AMBULATORY_CARE_PROVIDER_SITE_OTHER): Payer: Self-pay | Admitting: *Deleted

## 2008-06-29 ENCOUNTER — Encounter
Admission: RE | Admit: 2008-06-29 | Discharge: 2008-09-27 | Payer: Self-pay | Admitting: Physical Medicine and Rehabilitation

## 2008-07-01 ENCOUNTER — Ambulatory Visit: Payer: Self-pay | Admitting: Physical Medicine and Rehabilitation

## 2008-07-02 ENCOUNTER — Encounter: Admission: RE | Admit: 2008-07-02 | Discharge: 2008-07-02 | Payer: Self-pay | Admitting: Orthopedic Surgery

## 2008-07-04 ENCOUNTER — Encounter: Admission: RE | Admit: 2008-07-04 | Discharge: 2008-07-04 | Payer: Self-pay | Admitting: General Surgery

## 2008-07-04 ENCOUNTER — Ambulatory Visit (HOSPITAL_COMMUNITY): Admission: RE | Admit: 2008-07-04 | Discharge: 2008-07-04 | Payer: Self-pay | Admitting: General Surgery

## 2008-07-04 ENCOUNTER — Encounter (INDEPENDENT_AMBULATORY_CARE_PROVIDER_SITE_OTHER): Payer: Self-pay | Admitting: General Surgery

## 2008-07-28 ENCOUNTER — Ambulatory Visit: Payer: Self-pay | Admitting: Physical Medicine & Rehabilitation

## 2008-07-28 ENCOUNTER — Encounter
Admission: RE | Admit: 2008-07-28 | Discharge: 2008-09-07 | Payer: Self-pay | Admitting: Physical Medicine & Rehabilitation

## 2008-07-28 ENCOUNTER — Encounter
Admission: RE | Admit: 2008-07-28 | Discharge: 2008-07-28 | Payer: Self-pay | Admitting: Physical Medicine & Rehabilitation

## 2008-07-29 ENCOUNTER — Ambulatory Visit: Payer: Self-pay | Admitting: Physical Medicine and Rehabilitation

## 2008-07-30 ENCOUNTER — Emergency Department (HOSPITAL_COMMUNITY): Admission: EM | Admit: 2008-07-30 | Discharge: 2008-07-30 | Payer: Self-pay | Admitting: Emergency Medicine

## 2008-08-02 ENCOUNTER — Telehealth (INDEPENDENT_AMBULATORY_CARE_PROVIDER_SITE_OTHER): Payer: Self-pay | Admitting: *Deleted

## 2008-09-08 ENCOUNTER — Ambulatory Visit (HOSPITAL_COMMUNITY): Admission: RE | Admit: 2008-09-08 | Discharge: 2008-09-08 | Payer: Self-pay | Admitting: Orthopaedic Surgery

## 2008-11-01 ENCOUNTER — Telehealth (INDEPENDENT_AMBULATORY_CARE_PROVIDER_SITE_OTHER): Payer: Self-pay | Admitting: *Deleted

## 2008-11-03 ENCOUNTER — Encounter
Admission: RE | Admit: 2008-11-03 | Discharge: 2008-11-08 | Payer: Self-pay | Admitting: Physical Medicine and Rehabilitation

## 2008-11-04 ENCOUNTER — Ambulatory Visit: Payer: Self-pay | Admitting: Physical Medicine and Rehabilitation

## 2008-12-09 ENCOUNTER — Encounter: Admission: RE | Admit: 2008-12-09 | Discharge: 2008-12-09 | Payer: Self-pay | Admitting: Obstetrics and Gynecology

## 2009-06-30 ENCOUNTER — Encounter: Admission: RE | Admit: 2009-06-30 | Discharge: 2009-06-30 | Payer: Self-pay | Admitting: Obstetrics and Gynecology

## 2009-08-09 ENCOUNTER — Ambulatory Visit: Payer: Self-pay | Admitting: Pulmonary Disease

## 2009-08-10 DIAGNOSIS — J45909 Unspecified asthma, uncomplicated: Secondary | ICD-10-CM | POA: Insufficient documentation

## 2009-08-12 ENCOUNTER — Encounter: Admission: RE | Admit: 2009-08-12 | Discharge: 2009-08-12 | Payer: Self-pay | Admitting: Orthopedic Surgery

## 2009-10-09 ENCOUNTER — Encounter: Admission: RE | Admit: 2009-10-09 | Discharge: 2009-10-09 | Payer: Self-pay | Admitting: Neurosurgery

## 2009-12-04 ENCOUNTER — Encounter: Admission: RE | Admit: 2009-12-04 | Discharge: 2009-12-04 | Payer: Self-pay | Admitting: Neurosurgery

## 2010-03-07 ENCOUNTER — Encounter: Payer: Self-pay | Admitting: Pulmonary Disease

## 2010-04-30 ENCOUNTER — Encounter: Payer: Self-pay | Admitting: Pulmonary Disease

## 2010-08-09 ENCOUNTER — Telehealth: Payer: Self-pay | Admitting: Pulmonary Disease

## 2010-10-04 ENCOUNTER — Encounter
Admission: RE | Admit: 2010-10-04 | Discharge: 2010-10-04 | Payer: Self-pay | Source: Home / Self Care | Attending: Neurosurgery | Admitting: Neurosurgery

## 2010-10-12 ENCOUNTER — Telehealth (INDEPENDENT_AMBULATORY_CARE_PROVIDER_SITE_OTHER): Payer: Self-pay | Admitting: *Deleted

## 2010-10-22 ENCOUNTER — Encounter
Admission: RE | Admit: 2010-10-22 | Discharge: 2010-10-22 | Payer: Self-pay | Source: Home / Self Care | Attending: Neurosurgery | Admitting: Neurosurgery

## 2010-10-27 ENCOUNTER — Encounter: Payer: Self-pay | Admitting: Obstetrics and Gynecology

## 2010-10-28 ENCOUNTER — Encounter: Payer: Self-pay | Admitting: Orthopedic Surgery

## 2010-10-28 ENCOUNTER — Encounter: Payer: Self-pay | Admitting: Obstetrics and Gynecology

## 2010-10-28 ENCOUNTER — Encounter: Payer: Self-pay | Admitting: Internal Medicine

## 2010-10-28 ENCOUNTER — Encounter: Payer: Self-pay | Admitting: Neurosurgery

## 2010-10-28 ENCOUNTER — Encounter: Payer: Self-pay | Admitting: Anesthesiology

## 2010-10-29 ENCOUNTER — Encounter: Payer: Self-pay | Admitting: Cardiovascular Disease

## 2010-11-07 ENCOUNTER — Encounter: Payer: Self-pay | Admitting: Neurosurgery

## 2010-11-08 NOTE — Medication Information (Signed)
Summary: CPAP Supplies/[facility]  CPAP Supplies/[facility]   Imported By: Sherian Rein 05/02/2010 11:06:23  _____________________________________________________________________  External Attachment:    Type:   Image     Comment:   External Document

## 2010-11-08 NOTE — Progress Notes (Signed)
Summary: appointment-LMTCB x 1  Phone Note Call from Patient   Caller: Patient Call For: dr. Shelle Iron Summary of Call: Patient phoned she needs to be seen  by Dr. Shelle Iron she has relocated to [facility] and has not found a pulmonary doctor there and stated that she wants to continue with Dr. Shelle Iron she will drive in every year for follow up. She will be in town next Tuesday and Wednesday and wants to know if she can be worked in. She needs a new mask and her prescription has expired. Patient has a new PCP at [facility] in [facility] [phone]. Patient can be reached at [phone] if she doesnt answer you can leave a message Initial call taken by: Vedia Coffer,  October 12, 2010 11:47 AM  Follow-up for Phone Call        Dr Shelle Iron,  Is it ok to work pt in for appt next week?  The only appts available are RN slots at 4:00 and 4:15 on Wed.  Please advise. Abigail Miyamoto RN  October 12, 2010 11:53 AM   Additional Follow-up for Phone Call Additional follow up Details #1::        ok for 4pm on wed, no later.  I have a big meeting that afternoon and need to get out.  If she is late, won't be seen.   Additional Follow-up by: Barbaraann Share MD,  October 12, 2010 1:44 PM    Additional Follow-up for Phone Call Additional follow up Details #2::    LMOMTCB Vernie Murders  October 12, 2010 2:06 PM  Spoke with pt and she states that this late of an appt will not work for her and she will have to call back next month when she plans to come back in town for appt. Follow-up by: Vernie Murders,  October 12, 2010 3:15 PM

## 2010-11-08 NOTE — Medication Information (Signed)
Summary: CPAP Supplies/[facility]  CPAP Supplies/[facility]   Imported By: Sherian Rein 03/13/2010 10:31:20  _____________________________________________________________________  External Attachment:    Type:   Image     Comment:   External Document

## 2010-11-08 NOTE — Progress Notes (Signed)
Summary: AUTHORIZATION  Phone Note Other Incoming Call back at [phone]   Caller: Patient Call placed by: [employer]  [place] Call placed to: [facility] Summary of Call: NEED PRIOR AUTHORIZATION FOR ROZEREM 8 MG CALL TO [phone] Initial call taken by: Rickard Patience,  November 01, 2008 10:17 AM  Follow-up for Phone Call        [facility] for PA.  Will await fax to be sent for [facility] to fill out.  Case ID # is [id number] Follow-up by: Vernie Murders,  November 01, 2008 10:38 AM

## 2010-11-08 NOTE — Progress Notes (Signed)
Summary: nos appt  Phone Note Call from Patient   Caller: [email or web address]  Call For: clance Summary of Call: LMTCB x2 to rsc nos from 11/2. Initial call taken by: Darletta Moll,  August 09, 2010 2:54 PM

## 2011-02-19 NOTE — Group Therapy Note (Signed)
[name] is a 50 year old married woman who was kindly referred  by Dr. Hilda Lias for pain management.   [name] has a long history of fibromyalgia and has had a  cervical fusion back in September 2002 as well as low back fusion in  October 2005.  She has also had multiple foot surgeries and shoulder  surgeries and she currently is followed by Dr. Charlett Blake for some problems  with her left knee which have been plaguing her lately.  She is recently  coming off of the course of prednisone to help manage the left knee  pain, and she has an MRI of the left knee pending tomorrow.   She had up until about a half year ago had been following up [facility] for pain management for approximately 2-1/2 years.  Apparently, she was discharged because she had not kept a couple of  appointments, but I do not have notes from Dr. Vear Clock[facility] clinic  confirming this.   She states her chief complaint today is the low back pain which she has  had for many years.  She states her average pain is about a 7 on a scale  of 10 and in the clinic right now, she states her pain at this time is  8/10 on a scale of 10.  Her pain is described as sharp, burning,  stabbing, constant, tingling and aching.  Her pain diagram indicates  pain as well in the cervical region, throughout the thoracic region,  bilateral elbows, into the fingers of bilateral hands, left knee, right  knee, lateral hips, and down into the feet bilaterally.   She reports overall poor sleep.  Pain is constant throughout the day.  Pain is worse with bending and sitting.  Her pain improves with rest,  heat, medication and pacing her activities.   Pain medications currently prescribed through Dr. Charlett Blake and Dr. Marcelyn Bruins include;  1. Trazodone 250 once a day.  2. Dilaudid 2 mg 1 p.o. every 6 hours.  3. She takes p.r.n. Advil.   FUNCTIONAL STATUS:  She walks without assistance.  She states she can  walk less  than an hour.  She has difficulty with stairs.  She does  drive.  She has been on Social Security Disability since 2003.  She was  last employed back in 2000.  She is independent with her self-care with  respect to feeding, dressing, bathing, toileting, meal prep and  household duties.   She spends her day on the phone, on the computer.  She enjoys tracing  her genealogy and enjoys going out to lunch and reading.   She had engaged in a [employer] at the [employer] approximately 1 year ago.   REVIEW OF SYSTEMS:  She reports some intermittent bladder control  problems.  She does not need diaper, however, she has had a bladder  tacking.  She reports occasional numbness and tingling especially in the  first 3 fingers, the bilateral upper extremities.  She reports trouble  walking and spasms.  Denies depression.  Occasional anxiety.  Denies  suicidal ideation.   Review of systems is notable for night sweats, irritable bowel syndrome,  gastroesophageal reflux, sleep apnea, and asthma.   Physicians currently involved in her care include Dr. Jeral Fruit, Dr.  Cleophas Dunker, Dr. Charlett Blake, Dr. Eden Emms and Dr. Shelle Iron.   PAST MEDICAL HISTORY:  Positive for irritable bowel syndrome,  gastroesophageal reflux, and hypothyroidism.   PAST SURGICAL HISTORY:  Positive for  the following shoulder arthroscopic  surgery on December 10, 2007, and on Feb 09, 2008, Dr. Cleophas Dunker;  hysterectomy on September 24, 1997; gallbladder on December 11, 2000; bladder  tacking on December 11, 2000; cervical fusion C3 through C7 on June 17, 2001; low back fusion on October 2005, Dr. Jeral Fruit; multiple foot  surgeries Dr. Charlett Blake in 1999 and Dr. Al Corpus at the [facility].   SOCIAL HISTORY:  She lives with her husband.  Denies alcohol or smoking  and denies illegal substance use.   FAMILY HISTORY:  The patient is unsure of her family history.  She has  been adopted.   MEDICATIONS:  She brings in today include;  1. Trazodone 250.  2.  Rozerem.  3. __________.  4. Proventil.  5. Advair.  6. Benadryl 100 mg.  7. Advil 400 p.r.n.  8. Melatonin.  9. GI cocktail, Dr. Carin Hock.   PHYSICAL EXAMINATION:  VITAL SIGNS:  Blood pressure is 118/58, pulse is  106, respiration is 20, and she is 96% saturating on O2.  GENERAL:  She is a morbidly obese female who appears to her stated age.   She is oriented x3.  Her speech is clear.  Her affect is bright.  She is  alert, cooperative and pleasant. She follows commands easily.   Her cranial nerves appeared grossly intact.  Coordination is intact.  Reflexes are 2+ at the biceps, triceps, brachioradialis bilaterally, 2+  at the patellar tendons, 0 at the Achilles tendons bilaterally.  There  is no abnormal tone noted.  No clonus is noted.  No fasciculations or  tremors are appreciated on exam today.   She denies any sensory deficits to light touch in the upper or lower  extremities.   Motor strength in the upper extremities is in the 5/5 range without  focal deficits, in the lower extremities 5/5, at hip flexors, knee  extensors, dorsiflexors, plantar flexors, EHL as well.   Straight leg raise is negative.   She is able to stand independently.  After sitting, she does have little  trouble and seems to push off little bit with her upper extremities to  get out the chair.  Her gait in the room is slightly antalgic with  decreased weightbearing in the left lower extremity.  She has mild  limitations in the cervical motion, lacking full rotations to the left  by about 10 degrees.  Flexion, extension appeared to be fairly well  maintained.  She has full shoulder range of motion bilaterally, external  rotation is 90 bilaterally at the shoulders and internal rotation is  approximately 75-80 degrees bilaterally.  Lumbar range of motion is  mildly limited in all planes.  She has full knee range of motion  bilaterally.  She does complain a pain with bending and flexing the left  knee  because she does have quite a bit of redundant tissue around the  knee.  Bilateral knees, it is difficult to really determine whether  there is an effusion here.  No crepitus is noted.  She does have some  medial joint line, some mildly lateral joint line is noted as well.  She  may have a little bit of ligamentous laxity medial laterally.  I am not  able to really determine whether there is some AP laxity as well.  I do  not believe there is.   She has tenderness along both iliotibial bands bilaterally to the knee  from the trochanter to the knee.  She is  tender throughout this lateral  hip.  Exclusive tenderness noted over the trochanters bilaterally as  well today.  She has multiple areas of tenderness with palpation  throughout the parascapular region, medial elbows, medial knees as well,  and in the lumbar paraspinal musculature multiple tender points are  noted today.   Overall, balance is good.  Romberg's test was performed adequately.  Difficult to test tandem gait due to left knee pain currently.   IMPRESSION:  1. Long history of fibromyalgia.  2. Bilateral trochanteric bursitis/iliotibial band syndrome.  3. Status post lumbar surgery in October 2005, Dr. Jeral Fruit at L5-S1.  4. Status post cervical fusion on June 17, 2001.  5. History of rotator cuff tendinitis bilaterally status post      arthroscopic surgery bilaterally, Dr. Cleophas Dunker.  6. History of chronic foot and ankle problems status post multiple      surgeries Dr. Charlett Blake as well as Dr. Al Corpus.  7. History of upper extremity numbness into the thumb, index, and      middle finger.  8. Left knee pain, currently being workup by Dr. Charlett Blake, MRI pending.      The patient recently came off of oral steroids.   Medical problems include hypothyroidism.  She did have a rheumatologic  workup 5-8 years ago by Dr. Corliss Skains, which apparently was negative.   PLAN:  Currently, we will await Dr. Eilleen Kempf workup of the left  knee.  Apparently, the MRI is pending.  We would like to consider physical  therapy to address fibromyalgia as well as modalities to the lateral  hips, ultrasounds, soft tissue work, massage and transitioning back to  water aerobic program, would consider further physical therapy in the  rounds of cervical as well as lumbar stabilization type exercises,  basically gentle physical therapy.   The patient does have multiple drug intolerances as well as skin  sensitivities.  She has trialed Lyrica and Topamax both past visual  problems and elevated blood pressure.  Tizanidine and hydrocodone have  not been helpful for her in the past.  She states Neurontin has not  helped her as well.   She states oxycodone and Dilaudid do seem to help as well as Advil.   Given her skin intolerance, it is doubtful that TENS unit will be  successful with her as the Leads need to be adhered to the skin.  Ms.  Vogt has been followed by Dr. Vear Clock for 2-3 years prior to  being in our clinic.  We would like to obtain notes from his clinic  regarding his care with her.  We would tend to lean more toward non-  narcotic management with her long history of fibromyalgia.  May consider  tramadol with her at some point.  We will seer back in 1 month and await  MRI results of her left knee.  In addition, I have suggested to Ms.  Halk that she obtained a general practitioner internal medicine  or family practice to help her monitor her hypothyroid problem,  apparently she is currently not on any medicines for this.  She has been  taking care of by a more holistic-type physician at some point in the  past but is currently not on any medication for this and has not had her  thyroid levels checked for several months now.  I will also await urine  drug screen.           ______________________________  Brantley Stage, M.D.     DMK/MedQ  D:  07/01/2008 11:43:46  T:  07/02/2008 01:30:25  Job #:   [id number]   cc:   Hilda Lias, M.D.  Fax: [phone]

## 2011-02-19 NOTE — Op Note (Signed)
NAMEDONNICE, NIELSEN           ACCOUNT NO.:  [id number]   MEDICAL RECORD NO.:  [id number]          PATIENT TYPE:  OIB   LOCATION:  5039                         FACILITY:  [facility]   PHYSICIAN:  Claude Manges. Whitfield, M.D.DATE OF BIRTH:  1961-03-14   DATE OF PROCEDURE:  02/09/2008  DATE OF DISCHARGE:                               OPERATIVE REPORT   PREOPERATIVE DIAGNOSES:  1. Impingement right shoulder with partial rotator cuff tear and      degenerative joint disease, acromioclavicular joint.  2. 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   POSTOPERATIVE DIAGNOSES:  1. Impingement right shoulder with partial rotator cuff tear and      degenerative joint disease, acromioclavicular joint.  2. 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   PROCEDURE:  1. Diagnostic arthroscopy, right shoulder with debridement of shoulder      joint.  2. Arthroscopic subacromial decompression.  3. Arthroscopic distal clavicle resection.  4. Removal of right great toenail surgery.   SURGEON:  Claude Manges. Cleophas Dunker, MD   ASSISTANT:  Legrand Pitts. Duffy, PA-C   ANESTHESIA:  General with supplemental interscalene nerve block and  right great toe block with Marcaine without epinephrine.   COMPLICATIONS:  None.   HISTORY:  A 50 year old female with several month status post successful  left shoulder arthroscopic subacromial decompression and distal clavicle  resection for impingement.  She has done very well and is having similar  symptoms on the right shoulder.  MRI scan revealed evidence of  impingement and degenerative change of the [facility] joint.  She is now to have  an arthroscopic evaluation and she did so well on the left side.  She  has also suffered from chronic ingrowing of the right great toenail  associated with some chronic fungal infection and wishes to have that  removed.   PROCEDURE:  The patient was comfortable on the operating table and under  general orotracheal  anesthesia, the patient was placed in a semi-sitting  position with the shoulder frame.  The patient did receive a  preoperative interscalene nerve block in the holding area.  The right  shoulder was then prepped from the base of the neck circumferentially to  below the elbow with DuraPrep.  Sterile draping was performed.   Marking pen was used to outline the [facility] joint, the coracoid, and the  acromion, made a pointed fingerbreadth posterior and medial to the  posterior angle acromion.  A small stab wound was made and the  arthroscope easily placed into the shoulder joint.  Diagnostic  arthroscopy revealed some fraying of the superior labrum.  There was  also some partial rotator cuff tear and very minimal synovitis.  A  second portal was established anteriorly and shaving of the partial  rotator cuff tear was performed.  I did not see any evidence of loose  material on the joint.   The arthroscope was placed in subacromial space posteriorly, the cannula  in the subacromial space anteriorly and third portal was established in  the lateral subacromial space.  There was considerable bursal material  that was resected with the Cuda shaver  and the ArthroCare wand.  There  was also considerable anterior overhang and lateral overhang of the  acromion.  An anterior and lateral acromionectomy was performed with a 6-  mm bur and the ArthroCare wand in a very nice decompression.  Further  bursal tissue was then resected.  There were osteophytes at the [facility] joint  on both sides and 6-mm bur was used to remove any spurs.  The [facility] joint  was then resected with a 6-mm bur with a very nice resection.  There  were areas of synovitis and obvious chondromalacia into the clavicle.  The subacromial space was then explored with evidence of any further  loose material.  Puncture sites were infiltrated with Marcaine with  epinephrine.  A sterile bulky dressing was applied.  No stitches were  utilized.    Remaining sterile and changing gloves, the right foot was then prepped  with DuraPrep and then sterilely draped.  Marcaine without epinephrine  was injected on either side of the right great toe.  A Penrose drain was  applied and the toe was exsanguinated and a hemostat placed around the  Penrose drain.   I was able to lift the nail at its distal point with a mosquito hemostat  and then elevated from the nail bed and removed it one piece from the  nailbed.  The nailbed otherwise looked clean.  I applied triple  antibiotic ointment to the nailbed, then applied a sterile bulky  dressing.  The Penrose drain was released from the base of the toe with  immediate capillary refill to the toe.  Further bulky dressing was then  applied.   The patient was then awoken and returned to the post anesthesia recovery  room in satisfactory condition.   PLAN:  A 23-hour observation.  Dilaudid for pain, IV antibiotics.      Claude Manges. Cleophas Dunker, M.D.  Electronically Signed     PWW/MEDQ  D:  02/09/2008  T:  02/10/2008  Job:  [id number]

## 2011-02-19 NOTE — Op Note (Signed)
NAMEAMBERLE, LYTER           ACCOUNT NO.:  [id number]   MEDICAL RECORD NO.:  [id number]          PATIENT TYPE:  AMB   LOCATION:  SDS                          FACILITY:  [facility]   PHYSICIAN:  Claude Manges. Whitfield, M.D.DATE OF BIRTH:  11/21/1960   DATE OF PROCEDURE:  09/08/2008  DATE OF DISCHARGE:  09/08/2008                               OPERATIVE REPORT   PREOPERATIVE DIAGNOSES:  1. Tricompartmental degenerative joint disease, left knee with      possible suprapatellar pouch loose body.  2. Plica.   POSTOPERATIVE DIAGNOSES:  1. Tricompartmental degenerative joint disease, left knee without      loose body.  2. Plica.   PROCEDURES:  1. Diagnostic arthroscopy, left knee.  2. Shaving of patella.  3. Shaving of medial femoral condyle with microfracture.  4. Plicectomy.   SURGEON:  Claude Manges. Whitfield, MD   ANESTHESIA:  IV sedation and local 1% Xylocaine with epinephrine.   COMPLICATIONS:  None.   HISTORY:  A 50 year old female has been experiencing trouble with her  left knee over the last several months.  She felt something moving in  her knee and some catching and possible swelling.  She she has very  large legs and weighing in excess of 300 pounds and it has been  difficult for her to know for sure if she has got a torn cartilage.  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  She is  now to have an arthroscopic evaluation.   PROCEDURE:  With the patient comfortable on the operating table, she was  placed under IV sedation.  She had a preoperative knee block.  The left  lower extremity which had been marked as the appropriate operative  extremity was placed in the thigh holder.  The leg was then prepped with  DuraPrep from the thigh holder of the ankle.  Sterile draping was  performed.   Diagnostic  arthroscopy was performed using medial and lateral  parapatellar tendon puncture site.  There was minimal clear yellow  effusion on insertion of the arthroscope medially.  I had difficulty  arthroscoping because of the size of the knee.  The suprapatellar pouch  revealed diffuse beefy red and friable synovitis.  There was  considerable chondromalacia of the patella throughout its entirety.  A  second portal was established laterally and I shaped the patella so that  it was perfectly stable.  I also performed synovectomy.  I carefully  evaluated the suprapatellar pouch and there was a very thickened plica  medially, but I did not see evidence of loose body.  The plica was  excised with the ArthroCare wand.  I again explored the suprapatellar  pouch 2 other occasions and still did not see loose body.  I debrided  the gutters so that I could evaluate them with the ArthroCare wand, did  not see evidence of loose body.   The lateral compartment was evaluated initially and there was some mild  chondromalacia, maybe grade 1.  The lateral meniscus was intact.  There  was some synovitis along the periphery of the compartment.   The intercondylar notch was identified.  The ACL was intact.  There was  some synovitis which was debrided with the ArthroCare wand.   The medial compartment revealed considerable chondromalacia including  grade 4 of the medial femoral condyle.  There was loose articular  cartilage.  This was debrided with the ArthroCare wand and then I  performed a microfracture.  The medial meniscus was intact.  There was  synovitis along the periphery, which I debrided.  I again carefully  checked both gutters in the suprapatellar pouch and did not see loose  body and the remaining medial plica was excised from the medial gutter.  The joint was then explored.  There was some loose material.  The 2  puncture sites left open and infiltrated with 0.25% Marcaine with  epinephrine.   The  patient tolerated the procedure without complications.  We would  sent her home by Outpatient Day Surgery.   PLAN:  Dilaudid, crutches, office 1 week.      Claude Manges. Cleophas Dunker, M.D.  Electronically Signed     PWW/MEDQ  D:  09/08/2008  T:  09/08/2008  Job:  [id number]

## 2011-02-19 NOTE — H&P (Signed)
Beth [name], Beth [name]           ACCOUNT NO.:  [id number]   MEDICAL RECORD NO.:  [id number]          PATIENT TYPE:  EMS   LOCATION:  ED                           FACILITY:  [facility]   PHYSICIAN:  Anselm Pancoast. Weatherly, M.D.DATE OF BIRTH:  [birth year]   DATE OF ADMISSION:  07/30/2008  DATE OF DISCHARGE:  07/30/2008                              HISTORY & PHYSICAL   CHIEF COMPLAINT:  Drainage from a breast biopsy site one month ago.   HISTORY OF PRESENT ILLNESS:  Beth [name] is a 50 year old female  who Dr. Bertram Savin did a breast biopsy on the right breast  approximately one month ago that was told to be fat necrosis.  The  patient's incision appeared to be healing satisfactory.  She was seen  originally, and then was scheduled for an appointment next Tuesday.  During this past month, she has had a urinary tract infection and she  was first on Macrobid and switched to Cipro, and she said that she has  recovered from the urinary tract infection, but then started having a  small amount of drainage from the breast site today.  She has a lot of  allergies and was allergic to sutures and etc., also penicillin, and  told Dr. Freida Busman what type of sutures and she could use, and I am sure  these instructions were followed.   PHYSICAL EXAMINATION:  BREAST.  There is no obvious redness.  There is a  little area medially that looks like a little skin changes from a Bovie  and then you can feel just a little bit of fluid.  She has very large  breasts and sterilely with Betadine prep, I aspirated approximately 10  mL of fluid, is slightly turbid.  Took it myself to a lab, and we did a  Gram stain and feel that we are not seeing any bacteria, and it has been  sent on to [facility] for a culture and sensitivity.  With no bacteria  seen on the Gram stain, the fluid is not frank pus by any means.  I  would tend to put her back on the Cipro.  She has an appointment to see  Dr. Freida Busman on Tuesday.  If this  is an obvious breast infection, then the  wound will need to be opened but with no obvious skin erythema and  obvious abscess and with removal of 10 mL of fluid that did not show  bacteria.  I would not open the wound at this time.  The patient was in  agreement with this.  I discussed the possibility about putting her on  Keflex.  She said she has anaphylactic-type reaction to penicillin.  She  would prefer that we go back on the Cipro, and at 290 pounds, I think  the 750 mg b.i.d. would be safe with someone of that size.  She is in  agreement with this.  She is going to kind of keep a little cover  dressing over it and will see Dr. Freida Busman on Tuesday.  The results of the  culture should be definitely available for that time.   IMPRESSION:  A seroma in a breast biopsy of 4 weeks ago, and hopefully  this is not a wound infection.  She has had a history of a urinary tract  infection.  She said she had a temperature 101, but when she got here,  her temperature was 99 when checked by the nurses.           ______________________________  Anselm Pancoast. Zachery Dakins, M.D.     WJW/MEDQ  D:  07/30/2008  T:  07/31/2008  Job:  [id number]

## 2011-02-19 NOTE — Op Note (Signed)
NAMEFARRAN, [name]           ACCOUNT NO.:  [id number]   MEDICAL RECORD NO.:  [id number]          PATIENT TYPE:  AMB   LOCATION:  SDS                          FACILITY:  [facility]   PHYSICIAN:  Lennie Muckle, MD      DATE OF BIRTH:  1961/09/21   DATE OF PROCEDURE:  07/04/2008  DATE OF DISCHARGE:  07/04/2008                               OPERATIVE REPORT   PREOPERATIVE DIAGNOSIS:  Mammographic detected right breast mass.   POSTOPERATIVE DIAGNOSIS:  Mammographic detected right breast mass.   PROCEDURE:  Needle-localized right breast biopsy.   SURGEON:  Amber L. Freida Busman, MD   ASSISTANT:  None.   ANESTHESIA:  General endotracheal anesthesia.   FINDINGS:  Needle is located within the specimen  by mammographic  findings.   COMPLICATIONS:  No immediate complications.   DRAINS:  No drains were placed.   INDICATIONS FOR PROCEDURE:  [name] is a 50 year old female who  is found to have a breast mass on screening mammogram. Due to the  patient's size, she was unable to get a core needle localized [facility].  Therefore, it is recommended to perform excisional  biopsy.  I discussed with the patient the risk of the surgery including  bleeding, infection, and seroma.   DETAILS OF PROCEDURE:  [name] was identified in the  preoperative holding area and her right breast was marked by me.  Once  in the operating room she was placed in the supine position.  After  administration of general endotracheal anesthesia, I trimmed the wire.  The mammograms post needle positioning were placed on the viewing board.  Her right breast was prepped and draped in the usual sterile fashion.  A  time-out procedure indictating the proper patient and procedure were  performed.  Using the wire as a guide as well as the mammogram, I placed  an incision directly on the skin where the wire was located and  dissected through the subcutaneous tissues with electrocautery.  I  placed an Allis clamp on the breast tissue and excised the breast tissue  using the wires and guide.  I went a depth of approximately 10 cm.  The  specimen was oriented with the wire as superior anterior border.  I  placed a long lateral suture and a deep short medial.  The specimen was  sent to the breast center for imaging.  After verifying the mass in the  biopsy specimen, I placed clips within the biopsy cavity for marking the  area if this had to be re-excised.  The area was irrigated.  There was  no evidence of bleeding.  I then closed the skin with a 4-0 Monocryl.  I  used 0.25% Marcaine 30 mL to inject the skin in the wound site.  A Steri-  Strip was placed for the  dressing.  The patient was then extubated and transferred to  Postanesthesia Care Unit in stable condition.  She has been given Extra  Strength Tylenol and ibuprofen for pain due to her multiple allergies.  She will follow up with me in approximately 2 or  3 weeks.      Lennie Muckle, MD  Electronically Signed     ALA/MEDQ  D:  07/04/2008  T:  07/05/2008  Job:  [id number]

## 2011-02-19 NOTE — Assessment & Plan Note (Signed)
HISTORY:  Beth [name] is a 50 year old married female, who is  referred by Beth [name] for pain management.   She was last seen on July 01, 2008.  In the interim, she has  undergone upper extremity electrodiagnostic study, which showed no  evidence of median or ulnar neuropathy and no evidence of an ongoing  cervical radiculopathy.   She also states that she has been following up with Beth [name] and had a  recent MRI, which she brought a copy in today, was dated on July 02, 2008.  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   Apparently, Beth [name] is set up for an arthroscopic surgery in  the upcoming couple weeks, apparently scheduled on August 09, 2008.   Beth [name] also has a long history of fibromyalgia.  She was noted  to have trochanteric bursitis, iliotibial band syndrome at the last  visit.  She has had previous lumbar surgery and cervical spine surgery  and history of rotator cuff tendinitis bilaterally.  She states she has  obtained a family practice physician.  She will have an appointment some  time in November.  The physician's name is Beth Horsfall, MD.   Her pain is located throughout multiple areas in her body including  cervical region, thoracic, lumbar, bilateral lower extremities, and into  the right and left flanks.  She describes the pain is fairly constant,  sharp, burning, stabbing, aching, and tingling in nature; worse with  bending; improves with heat; pain is constant throughout the morning,  daytime, evening, and into the night; and interfering significantly with  activity levels.   She is able to walk about 20-30 minutes at a time.  She does not climb  stairs.  She is able to drive.  However, she is independent with self-  care including feeding, dressing, bathing, toileting, meal prep,  and  shopping.  She does need some help with heavier household tasks.   REVIEW OF SYSTEMS:  Positive for intermittent problems with bladder,  numbness, and tingling.  Denies fevers, chills, night sweats, abdominal  or urinary complaints.  Denies respiratory or cardiac complaints at this  time.   No other changes in past medical, social, or family history since last  visit.   PHYSICAL EXAMINATION:  VITAL SIGNS:  Today, her blood pressure is  108/53, pulse 96, respirations 18, and 96% saturated on room air.  GENERAL:  She is an obese female who appears her stated age and does not  appear in any distress.  She is oriented x3.  Speech is clear.  Affect  is bright.  She is alert, cooperative, and pleasant.  Follows commands  without any difficulty.   Cranial nerves are grossly intact.  Coordination is intact.  Reflexes  are diminished in the lower extremities and 2+ in the upper extremities.  Motor strength is 5/5 without focal deficits today.   Musculoskeletal exam reveals diminished range of motion especially with  rotation to the left and decreased left lateral flexion in the cervical  spine as well.  She has full shoulder range of motion including internal  external rotation as well as abduction, limitations in the lumbar motion  are noted.  She has tenderness over the left knee and along the joint  line.   Her balance is good.  Tandem gait and Romberg  test are performed  adequately.  She does have an antalgic gait, however, with decreased  weightbearing in the left lower extremity due to left knee pain.   IMPRESSION:  1. Long history of fibromyalgia.  2. Bilateral trochanteric bursitis, iliotibial band syndrome.  3. Status post lumbar surgery in October 2005, Beth [name], at L5-S1.  4. Status post cervical fusion, June 17, 2001.  5. History of rotator cuff tendinitis bilaterally status post      arthroscopic surgery bilaterally, Beth [name].  6. History of chronic foot  and ankle problems status post multiple      surgeries, Beth [name] as well as Beth [name].  7. History of upper extremity numbness in the thumb, index, and middle      fingers.  Electrodiagnostic studies done within the last month with      arthroscopic surgery pending on August 09, 2008.   PLAN:  [name] has multiple allergies, which include LYRICA,  TOPAMAX, NEURONTIN, OXYCODONE, HYDROCODONE, SOMA, PENICILLIN, ULTRAM,  and CODEINE.  She also has MULTIPLE SKIN allergies and blisters easily  with ADHESIVES, and is not interested in any kind of pain management  modality that might use an adhesive such as TENS unit, Lidoderm,  Duragesic patches, or creams.   We would like to address her pain complaints using some physical therapy  including soft tissue work as well as ultrasound; however, in light of  the upcoming surgery on her left knee, we will hold off on considering  these options until after she finishes healing from her knee surgery.  To that end, we will set up an appointment for her at the beginning of  January when we will consider these other options for her.  She is in  agreement with these treatment options and will follow back up with [place]  after she has completed her orthopedic rehabilitation for her left knee.  She was given prescription for Flexeril today 5 mg 1 p.o. b.i.d. p.r.n.  back or neck spasm, #60, 1 refill as well as prescription for a straight  cane.  She states she has had couple of falls.  The left knee has given  out on her.  She is noted to have a loose body there and some mild  degenerative changes.  We will see her back in early January.           ______________________________  Brantley Stage, M.D.     DMK/MedQ  D:  07/29/2008 13:01:26  T:  07/30/2008 01:36:03  Job #:  [id number]

## 2011-02-19 NOTE — Assessment & Plan Note (Signed)
[facility]                            CARDIOLOGY OFFICE NOTE   NAME:Ozawa, SHADI LARNER                  MRN:          [id number]  DATE:06/01/2008                            DOB:          02-23-1961    HISTORY:  Davena returns today for followup.   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   From a cardiac standpoint, she has been seen in the past for  hypertension, functional dyspnea, palpitations, and atypical chest pain.   Alee continues to be on the heavy side.  She is up at 300 pounds;  however, her health is improving.  She went to see an alternative  healthcare person and got off all of her blood pressure medications.  She is trying to watch her diet.  She had both shoulders operated on the  left for rotator cuff injury and the right for bone spurs.  She has had  her right toenail removed for chronic fungal infection.  She seems to be  much more mobile and happy.   Her palpitations are gone.  She is wearing her CPAP at night and  continues to see Dr. Shelle Iron for her sleep apnea.  She is not having any  significant chest pain, PND, or orthopnea.  She has had atypical chest  pain in the past.  She had an echocardiogram on June 23, 2007,  which showed good LV function with no significant valvular heart  disease.   REVIEW OF SYSTEMS:  Otherwise negative.   PHYSICAL EXAMINATION:  GENERAL:  Her exam is remarkable for a jovial  white female in no distress.  VITAL SIGNS:  Weight is 304, blood pressure is 129/89, pulse is 88 and  regular, respiratory rate 14, and afebrile.  HEENT:  Unremarkable.  NECK:  Carotids normal without bruit.  No lymphadenopathy, thyromegaly,  or JVP elevation.  She has an anterior cervical neck scar and bilateral  shoulder scars.  LUNGS:  Clear.  Good diaphragmatic motion.  No wheezing.  HEART:  There is an S1 and S2 with distant heart sounds.  PMI not  palpable.  ABDOMEN:  Protuberant.  Bowel sounds positive.  No AAA.  No tenderness.  No bruit.  No hepatosplenomegaly, hepatojugular reflux.  EXTREMITIES:  Distal pulses are intact.  No edema.  She has had removal  of the great toenail on the right side.  NEURO:  Nonfocal.  SKIN:  Warm and dry.  No muscular weakness.   LABORATORY DATA:  Her electrocardiogram shows sinus rhythm with RSR  prime in V1.   CURRENT MEDICATIONS:  Appear to only include Cymbalta 60 a day,  melatonin, and trazodone.   IMPRESSION:  1. Previous atypical chest pain, gone.  No indication for further      stress testing.  2. Hypertension, currently well controlled with just low-salt diet.  3. Central obesity.  Consider bariatric surgery in the future.  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  She is trying to watch her diet and      decrease her caloric intake.  4. History of a tremor, followup neurology.  Continue combination of      trazodone, Cymbalta, and Rozerem.   Overall, I think the patient is doing fine.  She does not need further  cardiac testing.  Murdis tells me that her and family will be in moving to  [place], he has had a better job opportunity there, and she will  be closer to her son who lives in [place].   I told her I would miss taking care of the 2 of them, but wished her  luck.     Noralyn Pick. Eden Emms, MD, [facility]  Electronically Signed    PCN/MedQ  DD: 06/01/2008  DT: 06/02/2008  Job #: [id number]

## 2011-02-19 NOTE — Assessment & Plan Note (Signed)
[facility]                         GASTROENTEROLOGY OFFICE NOTE   NAME:[name] [name] SANAGUSTIN                  MRN:          [id number]  DATE:09/09/2007                            DOB:          [birth year]    CHIEF COMPLAINT:  Reflux and cough.   HISTORY:  [name] [name] had run out of her Aciphex.  Before that, she was taking  it three times a day and it was not really helping, and she was having  cough and hoarseness.  She tells me she was visiting her aunt, who likes  herbals and probiotics, and for some reason she took an antifungal  preparation and a probiotic for a couple of weeks to try to treat this.  It did not work.  She had seen Dr. Shelle [name] recently and he had given her  Zegerid 40 mg b.i.d.  She has been on that a week or so and continuing  on her Reglan and that seems to be helping, i.e., the Zegerid.  She is  still hoarse and still coughing, but she has started to notice a trend  that that is better.  Her extensive past medical history is reviewed and  unchanged otherwise.  I have not seen her in about a year.  Her biggest  problem continues to be her morbid obesity.  She had lost down to 288  pounds when she saw Dr. Eden [name] in September, but now her left foot is in  a cast, and the weight with Dr. Shelle [name] on the 24th of September was at  304.  She may or may not have had her boot on at that time.  At any  rate, she remains very obese.  She had an endoscopy and a barium swallow  last year with a small hiatal hernia, some food retention, and  nonerosive gastritis.  She was complaining of dysphagia at that time.  She was biopsied for eosinophilic esophagitis and that was unremarkable.  Medications are  listed and reviewed in the chart.  Numerous allergies  are listed, reviewed, and updated.   PHYSICAL EXAMINATION:  VITAL SIGNS:  Weight estimated to be 282 pounds.  She was not weighed today.  Height 5 feet 3-1/2 inches, pulse 88, blood  pressure  100/60.   ASSESSMENT:  1. Gastroesophageal reflux disease.  2. Morbid obesity, which is working against her.  3. Noncompliance with medication.  She did not call me for a refill.      She said they could not refill it because it was a three month      supply.  She seems to be somewhat better on the Zegerid, would      continue that.  Sometimes there is tachyphylaxis to different      medications and it sounds like she might have had that to the      Aciphex.  I reminder her that hoarseness and proximal reflux      symptoms can take weeks to months to resolve.  She has a pending      appointment with an otolaryngologist.  I will see her back as      needed  at this point.     Beth Boop, MD,FACG  Electronically Signed    CEG/MedQ  DD: 09/09/2007  DT: 09/09/2007  Job #: [id number]

## 2011-02-19 NOTE — Op Note (Signed)
Beth [name], Beth [name]           ACCOUNT NO.:  [id number]   MEDICAL RECORD NO.:  [id number]          PATIENT TYPE:  OIB   LOCATION:  5010                         FACILITY:  [facility]   PHYSICIAN:  Claude Manges. Whitfield, M.D.DATE OF BIRTH:  [birth year]   DATE OF PROCEDURE:  12/08/2007  DATE OF DISCHARGE:                               OPERATIVE REPORT   PREOPERATIVE DIAGNOSIS:  Impingement left shoulder, with degenerative  joint disease acromioclavicular joint.   POSTOPERATIVE DIAGNOSIS:  Impingement left shoulder, with degenerative  joint disease acromioclavicular joint, with partial rotator cuff tear,  and mild arthritis left shoulder, with synovitis.   PROCEDURES:  1. Arthroscopic debridement left shoulder.  2. Arthroscopic subacromial decompression.  3. Arthroscopic distal clavicle resection.   SURGEON:  Claude Manges. Cleophas Dunker, M.D.   ASSISTANT:  Arlys John D. Petrarca, P.A.-C.   ANESTHESIA:  General, with a supplemental interscalene nerve block.   COMPLICATIONS:  None.   HISTORY:  This 50 year old female has been followed by Dr. Charlett Blake for a  period of months for problems referable to her left shoulder.  She had  an injury in November 2008, when she fell directly out of bed onto her  shoulder, and has had difficulty raising arm without pain.  She has had  an MRI scan that reveals a partial articular surface tearing of the  infraspinatus and supraspinatus tendons, without evidence of a full-  thickness tear.  She does have an impingement and moderate AC joint  pathology.  She is now to have an arthroscopic evaluation.  She has not  responded to cortisone and conservative treatment.   PROCEDURE:  With the patient comfortable on the operating room table,  and under general orotracheal anesthesia, the patient was placed in a  semi-sitting position with the shoulder frame.  At 300 pounds, the  patient was quite large, and it made the procedure difficult because of  the body habitus.  The  patient did have a preoperative interscalene  nerve block.   The patient was placed in a semi-sitting position with the shoulder  frame.  The left shoulder was then prepped with DuraPrep from the base  of the neck circumferentially to below the elbow.  Sterile draping was  performed.   A marking pen was used to outline the [facility] joint, the coracoid, and the  acromion.  At a point a fingerbreadth posterior and medial to the  posterior angle of the acromion, a small puncture site was made, and the  arthroscope easily placed in the subacromial space in the shoulder  joint.  Diagnostic arthroscopy revealed an intact labrum.  I did not see  any loose bodies.  There was some early chondromalacia of the humeral  head and areas of articular cartilage softening.  There was some mild  synovitis.  The biceps appeared to be intact.  A second portal was  established anteriorly with a ribbed cannula.  Shaving of the synovitis  was performed.  At that point, I could see that there was some articular  surface tearing of the infra- and supraspinatus, and these were debrided  as well with the [employer] wand.  The arthroscope was placed in the subacromial space posteriorly, the  cannula in the subacromial space anteriorly, and a third portal  established in the lateral subacromial space.  An arthroscopic  subacromial decompression was performed.  There was considerable adipose  tissue and bursal tissue that were debrided.  There was an obvious  anterior overhang of the acromion, and anterior-inferior acromioplasty  was performed with a 6 mm bur.   There was also considerable degenerative change and impingement at the  [facility] joint at both the clavicular and acromial surfaces.  A 6 mm bur was  used to perform further decompression of the acromion at the [facility] joint  and a distal clavicle resection.  I had a nice resection.  There were  areas of synovitis and obvious inflammatory changes of the [facility] joint.   The  joint was explored, without evidence of loose material.  I did not  see an obvious full-thickness rotator cuff tear.   Copious irrigation was performed with saline.  The anterior and lateral  wounds were closed with nylon.  The posterior puncture site was left  open.  A sterile bulky dressing was applied.   We will plan to keep the patient overnight.  Percocet for pain.      Claude Manges. Cleophas Dunker, M.D.  Electronically Signed     PWW/MEDQ  D:  12/08/2007  T:  12/09/2007  Job:  [id number]

## 2011-02-19 NOTE — Assessment & Plan Note (Signed)
Beth [name] is a 50 year old married woman who was last seen by me  on July 29, 2008.  She had been seen for multiple pain complaints,  which included her neck, thoracic area, low back, and bilateral legs.   She had also been having problems with her left knee.   In the interim, she has undergone a left arthroscopic knee surgery by  Dr. Norlene Campbell and has since been diagnosed with diabetes by Dr.  Ruthine Dose.  In the interim, she was also started on Amaryl as well as  Savella.   She states that couple of weeks after starting Savella, she had an  overall improvement in her pain.  Her average pain prior to [facility] was  about a 9 on a scale of 10.  Currently in clinic, it is between 4 and 5.  Her activity is only moderately to little-affected right now by her  pain.  She reports overall improvement in pain.  Now, she does have some  mild low back pain and occasional interscapular pain.   She has no longer any leg pain, shoulder pain, or neck pain.   She is engaged now in a exercise program, walking at least 3 times a  week.  She can go 60 minutes at a time.  She is also doing some  exercises using a WII.   She is able to climb stairs and drive.   She is independent with self-care, admits to occasional numbness in the  upper extremities.  This is not a persistent problem for her currently  and is not a problem at this time in clinic as she is being interviewed  and examined.   PAST MEDICAL HISTORY:  Now, positive for new diagnosis of diabetes,  currently started on Amaryl 2 mg once a day and is currently monitoring  blood sugars.  Thyroid function tests are planned for her in March by  Dr. Ruthine Dose.   Medications which have been in the past provided by this clinic,  Flexeril 5 mg b.i.d.  She did not need refills today.   PHYSICAL EXAMINATION:  Blood pressure is 151/72, pulse 92, respiration  18, and 97% saturated on room air.  She is an obese well-developed  female who does  not appear in any distress.   She is oriented x3.  Her speech is clear.  Her affect is bright,  enthusiastic.  She follows commands without difficulty and answers  questions appropriately.   Cranial nerves are grossly intact.  Coordination is intact.  Reflexes  are diminished in the lower extremities, 1+ in the upper extremities.  There is no abnormal tone, clonus, or tremors noted.  Sensation is  intact to light touch in upper and lower extremities.  Motor strength is  5/5 without focal deficit today.   Transitioning from sitting to standing is done with ease.  Gait in the  hall reveals a normal stride length, normal base of support with normal  heal-to-toe mechanics.  She has a little trouble with tandem gait.  Romberg test is, however, performed adequately.   IMPRESSION:  1. Long history of fibromyalgia, overall improved since starting      Savella.  2. Bilateral trochanteric bursitis.  Iliotibial band syndrome not a      problem at the time.  3. Status post lumbar surgery in October 2005, Dr. Jeral Fruit, at L5-S1.  4. Status post cervical fusion, June 17, 2001.  5. History of rotator cuff tendonitis bilaterally, status post  arthroscopic surgery bilaterally, Dr. Cleophas Dunker.  Currently, with      good range of motion in both shoulders, 150 degrees of abduction      bilaterally noted on exam.  6. History of chronic foot and ankle problems, status post multiple      surgeries, Dr. Charlett Blake as well as Dr. Al Corpus.   PLAN:  [name] is overall doing well with respect to her  function and pain.  Amada Jupiter seems to helped her a great deal.  Pain  scores have significantly improved since her arthroscopic surgery,  treatment of diabetes, and initiation of Savella.  At this point, we  will see her back on a p.r.n. basis.            ______________________________  Brantley Stage, M.D.     DMK/MedQ  D:  11/04/2008 11:41:07  T:  11/05/2008 02:50:19  Job #:  [id number]

## 2011-02-19 NOTE — Assessment & Plan Note (Signed)
[facility]                            CARDIOLOGY OFFICE NOTE   NAME:Kludt, JOLANE BANKHEAD                  MRN:          [id number]  DATE:03/10/2007                            DOB:          1961/08/20    Beth [name] returns today for followup.  She is a 50 year old patient I have  seen for dyspnea with atypical chest pain.   Unfortunately, she continues to be extremely overweight.  I think a lot  of her dyspnea is functional.  She has had a recent CT scan which showed  old granulomatous disease.   The patient does not have a significant cough.  She does have asthma.   The patient has had a previous echo with good LV function.   There has been no previously documented coronary artery disease.   She has had some mild palpitations in the past which sound benign.  There has been no prolonged episodes, no syncope.  She has some chronic  lower extremity edema but no history of a DVT or PE.   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  I  had a long discussion with Amaka regarding some of the problems with  this.  Her resting heart rate is only 58.  She has a history of asthma  and I told her if she were to start a beta-blocker I would start at a  very low dose of a sympathomimetic beta-blocker which would not lower  her resting heart rate as much.  I told her a medicine like Visken at 5  mg might be reasonable.  Review of systems otherwise negative.   CURRENT MEDICATIONS:  Are myriad and listed in the chart.  They include:  1. Advair.  2. Aciphex 20 b.i.d.  3. Zanaflex.  4. Pravastatin 20 mg a day.  5. Lidocaine ointment to the back.  6. Nystatin.  7. Antivert.  8. Rozerem.  9. Albuterol.  10.Maxair.  11.Compazine.  12.Phenergan.  13.Zofran.   Her exam is remarkable for a chronically ill-appearing overweight white  female in no distress.  She does  appear somewhat depressed.  Her weight  is 309 pounds, pulse is 58, blood pressure is 130/80.  Resting sats are  100%, she is afebrile.  HEENT:  Normal.  NECK:  Has a surgical scar anteriorly.  There is no carotid bruits, no  lymphadenopathy, no JVP elevation.  LUNGS:  Clear without wheezing.  There is diaphragmatic motion.  HEART:  S1, S2 with normal heart sounds.  PMI is not palpable.  ABDOMEN:  Benign.  There is no organomegaly, no lymphadenopathy, no  hepatosplenomegaly, no hepatojugular reflux.  She has had previous lumbar back surgery.  Femorals are not palpable,  they are deep.  There is +2 popliteals bilaterally.  There is trace  lower extremity edema.  NEUROLOGICAL EXAM:  Nonfocal.  She actually does not have all that much  of a resting tremor in her hands today.  There is no muscular weakness.   IMPRESSION:  1. Functional dyspnea related to  size and history of asthma.  2. Benign palpitations, not currently symptomatic.  3. Her baseline EKG is essentially normal.  4. History of resting tremor with the consideration of a beta-blocker      per [facility].  I gave her a prescription for Visken 5 mg b.i.d., to      start only once a day for 3 weeks if she were to start a beta-      blocker.   The patient will continue to watch her diet and try to increase her  activity level.  She does not want to consider bariatric surgery due to  personal experiences with 3 friends.   I will see Guadalupe back in about 6 months and hopefully also see her  husband back sometime in the next 6 months as well.     Noralyn Pick. Eden Emms, MD, [facility]  Electronically Signed    PCN/MedQ  DD: 03/10/2007  DT: 03/10/2007  Job #: [id number]

## 2011-02-19 NOTE — Assessment & Plan Note (Signed)
[facility]                            CARDIOLOGY OFFICE NOTE   NAME:Beth [name], Beth [name]                  MRN:          [id number]  DATE:05/29/2007                            DOB:          01-09-1961    Beth [name] is seen today in followup with her husband.  I take  care of both them.  They are both significantly overweight.  Beth [name] is in  excess of 300 pounds.  She is chronically ill.  I have seen her for  functional dyspnea with a history of asthma, benign palpitations, and  history of atypical chest pain.   In talking to [facility], she has had some recent urinary tract problems.  Apparently, there is some protein in her urine, and Dr. Brunilda Payor was  concerned about her blood pressure.  His office note indicated that she  had 15 mg of protein in her urinalysis with no RBCs or WBCs.  He treated  her dysuria with Vesicare, and told her to follow up with me because she  had protein in her urine and hypertension.  Part of the problem with  Beth [name] is that her blood pressure is extremely hard to take.  She has very  large arms, and I suspect people to do not take the time the do it  correctly.   The last time I saw her, she had a tremor, we had started her on  pindolol.  She has not had any significant headaches.  She does not take  her blood pressure at home.  She has chronic mild lower extremity edema  from her obesity.   In regards to her previous symptoms, her exertional dyspnea has not  changed.  She has not had any significant chest pain, and has been  compliant with her medications.   She has had a previous echo with good LV function, and no previously  documented coronary artery disease.   REVIEW OF SYSTEMS:  Is otherwise negative.   CURRENT MEDICATIONS:  Numerous.  They were annotated and documented in  the chart.  She is on:  1. Advair.  2. Aciphex.  3. Zanaflex.  4. Cymbalta.  5. Pravastatin 20 mg.  6. Lidocaine ointment.  7. Nystatin.  8. Trazodone 200 nightly.  9. Pindolol 5 mg b.i.d.   EXAMINATION:  Remarkable for a blood pressure of 115 bilaterally.  We  used a large cuff, and a Doppler to assess her systolic pressures.  If  you do not use a large cuff and Doppler, it is very difficult to palpate  or auscultate her blood pressures.  There was no evidence of significant  hypertension today.  Pulse was 70 and regular.  She is morbidly obese.  Affect is appropriate.  Pulse is 88 and regular.  Weight is 309.  Respiratory rate is 16.  NECK:  Supple.  She a scar anteriorly on her neck from previous cervical  surgery.  There are no bruits.  No JVP elevation.  No tenderness.  No  lymphadenopathy.  LUNGS:  Clear with good diaphragmatic motion.  No wheezing.  There was an S1 and S2 with distant heart  sounds.  PMI is not palpable.  ABDOMEN:  Protuberant.  She is status post hysterectomy.  No AAA.  Bowel  sounds were positive.  No hepatosplenomegaly.  No hepatojugular reflux.  No tenderness.  Distal pulses are intact.  PTs are +2 bilaterally.  She has trace lower  extremity edema bilaterally.  NEUROLOGIC:  Nonfocal.  There is no muscular weakness.  SKIN:  Warm and dry.   IMPRESSION:  1. Blood pressure.  Currently appears stable.  Our blood pressure      readings at the office have been fine, and I suspect that people      just do not take the time to check Beth [name]'s blood pressure correctly.      I do not think she needs any further therapy.  2. Tremor.  Improved on pindolol.  Follow up with Neurology.  I      suspect this is also keeping her blood pressure at a good level.  3. Obstructive sleep apnea secondary to morbid obesity.  Currently      stable.  Not wearing CPAP mask at night.  This is patient's own      choice.  4. History of gastroesophageal reflux disease exacerbated by morbid      obesity.  Continue H2 blocker, Aciphex as needed.  5. Hypercholesterolemia.  Continue Pravastatin 20 mg a day.  No      documented  coronary artery disease.  Lipid and liver profile in 6      months.  6. Lower extremity edema.  Stable.  Chronic due to venous      insufficiency and morbid obesity.  Continue current dose of      diuretic.  7. Dysuria with question of protein in urine.   The patient will have a BMET checked to make sure her creatinine is  fine.  She will have a 24-hour urine protein.  We will also do a CT scan  with renal angiography to make sure there is no abnormalities here.  Hopefully, there will not be any evidence of intrinsic kidney disease,  and I suspect that the protein in her urine will be non-nephrotic level.   She will follow up with Dr. Brunilda Payor in regards to her dysuria.     Noralyn Pick. Eden Emms, MD, [facility]  Electronically Signed    PCN/MedQ  DD: 05/29/2007  DT: 05/30/2007  Job #: [id number]

## 2011-02-22 NOTE — Op Note (Signed)
[facility]  Patient:    Beth [name], Beth [name]                    MRN: [id number] Proc. Date: 12/11/00 Adm. Date:  [id number] Attending:  Stephenie Acres CC:         Catalina Lunger, M.D.   Operative Report  PREOPERATIVE DIAGNOSES: 1. Stress urinary incontinence. 2. Cystocele.  POSTOPERATIVE DIAGNOSES: 1. Stress urinary incontinence. 2. Cystocele.  PROCEDURE:   Pubovaginal sling.  SURGEON:  Lindaann Slough, M.D.  ANESTHESIA:  General.  INDICATIONS:  The patient is a 50 year old female who has been complaining of frequency and stress urinary incontinence for the past several years.  The incontinence has been getting worse lately.  She changes about two to three pads a day.  She was found on physical examination to have a cystocele.  She is scheduled for a pubovaginal sling.  The procedure, risks, and benefits have been discussed with her.  She understands and is agreeable.  She is also scheduled for a laparoscopic cholecystectomy by Dr. Luan Pulling.  DESCRIPTION OF PROCEDURE:  The patient had a laparoscopic cholecystectomy done by Dr. Luan Pulling, and she was then placed in the dorsolithotomy position.  A Foley catheter was then inserted into the bladder.  The vaginal mucosa was infiltrated with 1% lidocaine with epinephrine.  Then an inverted U incision was made on the vaginal mucosa, and the vaginal mucosa was dissected from the periureteral tissues.  Then with blunt and sharp dissection, the retropubic space was entered on both sides.  Then a transverse incision was made in the infrapubic area.  The needle was then passed through the infrapubic incision behind the symphysis pubis and through the vaginal incision on the right side. The ends of the nylon sutures that were attached to the fascia lata graft were then passed through the eye of the Veress needle and brought out into the infrapubic incision.  The same procedure was doine on  the left side.  The fascia lata graft was then secured to the periureteral incision.  The Foley catheter was then removed.  A cystoscope was then inserted into the bladder, and the bladder was examined with both foroblique and right angle lenses. There was no evidence of bladder injury, and the graft was at the level of the bladder neck.  There is no bladder stone or tumor.  The ureteral orifices are in normal position and shape with clear efflux.  The wounds were then irrigated with bug juice, and the vaginal incision was then closed with 3-0 Vicryl.  The cystoscope was reinserted into the bladder, and a microvasive suprapubic catheter was passed under direct vision through the suprapubic area into the bladder.  Then the #1 nylon sutures were tied in the infrapubic area with the cystoscope in the bladder and with three fingers under the ties.  The wounds were then irrigated with bug juice.  The cystoscope was removed.  The skin was closed with skin staples.  A vaginal packing was then placed in the vagina.  The patient tolerated the procedure well and left the OR in satisfactory condition to postanesthesia care unit. DD:  12/11/00 TD:  12/12/00 Job: [id number]

## 2011-02-22 NOTE — Op Note (Signed)
Beth [name], Beth [name]           ACCOUNT NO.:  [id number]   MEDICAL RECORD NO.:  [id number]          PATIENT TYPE:  AMB   LOCATION:  DAY                          FACILITY:  [facility]   PHYSICIAN:  Lindaann Slough, M.D.  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   POSTOPERATIVE DIAGNOSES:  Recurrent urinary tract infections with urge  incontinence.   OPERATIVE PROCEDURE:  Cystoscopy and urethral dilation.   SURGEON:  Danae Chen, M.D.   ANESTHESIA:  General anesthesia.   INDICATIONS FOR PROCEDURE:  The patient is a 50 year old female who has  been complaining of frequency, urgency, urge incontinence and recurrent  urinary tract infection.  She had a pubo-vaginal sling in March 2002.  She is scheduled today for cystoscopy.   DESCRIPTION OF PROCEDURE:  Under general anesthesia the patient was  prepped and draped and placed in the dorsal lithotomy position.  A #22  panendoscope was inserted in the bladder.  The bladder mucosa is normal.  There is no stone or tumor in the bladder.  The ureteral orifices are in  normal position and shape with clear efflux.  There is no evidence of  erosion of the sling in the bladder.  The urethra is normal.  The  bladder was examined with both foroblique and right angle lenses.  Cystoscope was then removed.  The urethra was then dilated up to #32  [place].  Under bimanual examination there is no evidence of pelvic mass.  The patient tolerated the procedure well and left the operating room in  satisfactory condition to post anesthesia care unit.      Lindaann Slough, M.D.  Electronically Signed     MN/MEDQ  D:  10/03/2006  T:  10/03/2006  Job:  [id number]

## 2011-02-22 NOTE — Op Note (Signed)
Beth [name], FAXON           ACCOUNT NO.:  [id number]   MEDICAL RECORD NO.:  [id number]          PATIENT TYPE:  INP   LOCATION:  2899                         FACILITY:  [facility]   PHYSICIAN:  Hilda Lias, M.D.   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   PROCEDURE:  Removal of the lamina of L5 as well as the facet joint, total  gross diskectomy at L5-S1, interbody fusion with allograft and autograft,  decompression of the L5-S1 nerve root, pedicle screw of L5-S1,  posterolateral arthrodesis L5-S1.  C-Arm.  Cellsaver.   SURGEON:  Hilda Lias, M.D.   ASSISTANT:  Coletta Memos, M.D.   INDICATIONS FOR PROCEDURE:  The patient was admitted because of back pain  with radiation to both legs.  The patient had been seen in the Pain Clinic,  but she is not any better.  X-ray showed worsening of the degenerative disk  disease at L5-S1.  Surgery was advised.  The patient has multiple allergies,  especially to the suture material.  The risks were explained in the history  and physical.   DESCRIPTION OF PROCEDURE:  The patient was taken to the OR and after  intubation, she was positioned in a prone manner.  The back was prepped with  Betadine.  Because of her size, it was difficult to feel any bone landmark.  Incision was done in the midline through the skin, through a thick layer of  adipose tissue, down to the spine.  We identified the area of L5-S1.  Muscle  was retracted laterally.  From then on, we did an x-ray.  We were sure that  we were at the level of L5-S1.  The spinous process as well as the lamina  and the facet of L5 was removed.   The patient had quite a bit of adhesions  and lysis was accomplished.  We had decompression of the thecal sac as well  as the L5-S1 nerve root.  Having done this, we entered the disk space, first  on the right side and then the left side.  Total gross diskectomy was  removed.  The endplates were curetted and then we were able to insert two  bone grafts of 8 x 21.  In the middle and laterally, we used a mixture of  Vitoss as well as the autograft to fill out the rest of the disk space.  From then, we identified with the C-arm the pedicle of L5-S1.  Pedicle probe  was inserted followed by a tap.  Four screws of 5.5 x 40 were inserted  without any problems.  AP and lateral showed good position of the screws.  From then on, we went laterally and we removed the periosteum of L5  transverse process as well as the ala of the sacrum.  A mixture of autograft  and Vitoss was used to fill up the space.  From then on, we put  a rod on top of the screws and we secured that in place with screw tops.  AP  and lateral showed good position of the pedicle screws.  From then on, the  area was irrigated.  Valsalva maneuver was negative.  Epidural fentanyl was  left in the dural space and the wound was closed with Prolene and nylon for  the skin.       EB/MEDQ  D:  07/27/2004  T:  07/27/2004  Job:  [id number]   cc:   Lunette Stands, M.D.  [address]  [place] [address]  Fax: [phone]

## 2011-02-22 NOTE — Op Note (Signed)
   NAME:  EMBREE, BRAWLEY                     ACCOUNT NO.:  [id number]   MEDICAL RECORD NO.:  [id number]                   PATIENT TYPE:  AMB   LOCATION:  [facility]                                 FACILITY:  [facility]   PHYSICIAN:  Lindaann Slough, M.D.               DATE OF BIRTH:  Aug 23, 1961   DATE OF PROCEDURE:  06/09/2003  DATE OF DISCHARGE:                                 OPERATIVE REPORT   PREOPERATIVE DIAGNOSIS:  Meatal stenosis.   POSTOPERATIVE DIAGNOSIS:  Meatal stenosis.   PROCEDURE DONE:  1. Cystoscopy.  2. Urethral dilation.   SURGEON:  Lindaann Slough, M.D.   ANESTHESIA:  General.   INDICATION:  The patient is a 50 year old female, who has been complaining  of frequency, hesitancy, and suprapubic pain.  She had cystoscopy and  urethral dilation done about four months ago with marked improvement of her  symptoms.  Her symptoms have recurred.  She also had a pubovaginal sling  three years ago.  She is scheduled today for cystoscopy and urethral  dilation.   DESCRIPTION OF PROCEDURE:  Under general anesthesia, the patient was prepped  and draped and placed in the dorsal lithotomy position.  A #22 [place]  cystoscope was inserted in the bladder.  The bladder mucosa is normal.  There was no stone or tumor in the bladder.  There is no evidence of  submucosal hemorrhage.  The ureteral orifices are in normal position and  shape with clear efflux.  The cystoscopy was then removed.  The urethra was  then dilated with #32 [place].   The patient tolerated the procedure well and left the OR in satisfactory  condition to postanesthesia care unit.                                               Lindaann Slough, M.D.    MN/MEDQ  D:  06/09/2003  T:  06/09/2003  Job:  [id number]

## 2011-02-22 NOTE — Op Note (Signed)
[facility]  Patient:    Beth [name], Beth [name]                    MRN: [id number] Proc. Date: 12/11/00 Adm. Date:  [id number] Attending:  Stephenie Acres CC:         Griffith Citron, M.D.  Lindaann Slough, M.D.   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  OPERATIVE PROCEDURE:  Laparoscopic cholecystectomy with intraoperative cholangiogram and percutaneous true-cut liver biopsy.  SURGEON:  Catalina Lunger, M.D.  ASSISTANT:  Anselm Pancoast. Zachery Dakins, M.D.  ANESTHESIA:  General endotracheal.  DESCRIPTION OF PROCEDURE:  The patient was taken to the operating room and placed in the supine position. After adequate anesthesia was induced, she was given an endotracheal tube. The abdomen was prepped and draped in the normal sterile fashion.  Using a transverse infraumbilical incision, I dissected down to the fascia. The fascia was opened vertically. An #0 Vicryl pursestring suture was placed around the fascial defect. Under direct visualization, a 10 mm port was placed in the subxiphoid region and two 5 mm ports were placed in the right abdomen. The gallbladder was identified. Through a stab wound in the right upper quadrant, a true cut biopsy catheter was punched through the skin and into the abdominal cavity. It was passed into the liver at two separate sites and liver biopsies were taken. These were sent for pathologic evaluation. Areas of biopsy were then coagulated using Bovie electrocautery.  I then turned my attention to the gallbladder which was elevated. Dissection began down in the infundibulum. A very small cystic duct was identified, measured between 1-2 mm and it was clipped proximally and a small ductotomy was made. The Reddick catheter was then placed through a separate stab wound in the  right upper quadrant through an angiocath. It was placed down into the cystic duct. Cholangiogram was performed. Because of the patients body habitus and because of difficulty with the operating table, the common bile duct and both hepatic ducts could be visualized and there was free flow into the duodenum but the distal common bile duct could not be visualized. I still felt confident that the patient had no history of cholelithiasis and that we had an adequate study of the hepatic ducts, common bile duct and good flow into the duodenum and therefore, the catheter was removed. The cystic duct was then triply clipped and divided. The cystic artery was identified, triply clipped and divided. The gallbladder was taken off the gallbladder bed using Bovie electrocautery and removed through the umbilical port. Adequate hemostasis was ensured. All incisions were injected using Marcaine. The abdomen was allowed to deflate. All trocars were removed. The fascial defect was closed with an #0 Vicryl pursestring suture, the skin incisions were closed with subcuticular 4-0 Monocryl. Dermabond dressing was placed. The patient tolerated the procedure well and went to the PACU in good condition. DD:  12/11/00 TD:  12/12/00 Job: [id number]

## 2011-02-22 NOTE — H&P (Signed)
NAME:  Beth [name], Beth [name]                     ACCOUNT NO.:  [id number]   MEDICAL RECORD NO.:  [id number]                   PATIENT TYPE:  OIB   LOCATION:  2866                                 FACILITY:  [facility]   PHYSICIAN:  Hilda Lias, M.D.                DATE OF BIRTH:  [birth year]   DATE OF ADMISSION:  12/15/2002  DATE OF DISCHARGE:                                HISTORY & PHYSICAL   HISTORY OF PRESENT ILLNESS:  The patient is a lady who came to see me with  her husband a week ago because she was having back pain with radiation down  both legs, but she felt that the right one was 90% worse than the left one.  The patient had had injection without improvement.  She is complaining also  of some numbness in the right hand and in the right leg.  The patient tells  me that after the injection she developed more pain and this time she feels  that it is mostly in the left leg and in the right hand with some pain in  the right shoulder.  She feels that now the left leg is getting worse.  Because of the MRI and because she has failed conservative treatment, she  wants to proceed as soon as possible with surgery.   PAST MEDICAL HISTORY:  1. Three years ago, she had anterior cervical diskectomy at the level of C6-     7 because of cervical myelopathy.  2. Laparoscopy.  3. Foot surgery.  4. Hysterectomy.  5. Cholecystectomy.  6. Bladder surgery.   ALLERGIES:  The patient is allergic to PENICILLIN, SOMA, PENICILLIN,  PREDNISONE, ULTRAM, CLONIDINE, HYDROCODONE, OXYCODONE, WELLBUTRIN, VICRYL  SUTURES, SHELLFISH, and PEANUTS.   SOCIAL HISTORY:  The patient does not smoke and does not drink.   REVIEW OF SYSTEMS:  Positive for back pain, left leg pain, and right upper  extremity pain.   FAMILY HISTORY:  Unremarkable.   PHYSICAL EXAMINATION:  GENERAL APPEARANCE:  The patient came to my office  and she was having quite difficulty with the left leg and she was limping.  HEENT:   Normal.  NECK:  She has a scar anteriorly.  She has good flexibility.  LUNGS:  Clear.  HEART:  Heart sounds normal.  ABDOMEN:  There is a scar from previous surgery.  The rest is negative.  EXTREMITIES:  Normal.  NEUROLOGIC:  Mental Status:  Normal.  Cranial Nerves:  Normal.  Strength is  5/5 without weakness of plantar flexion of the left foot.  There is no  __________.  Reflexes are symmetrical with absence of the left ankle jerk.  Sensation:  She complains of numbness when involves mostly the L5-S1 nerve.  Straight leg raise on the left side is positive at 45 degrees and on the  right side is about 80 degrees.   LABORATORY DATA:  The MRI showed that indeed she has a  herniated disk at the  level of L5-S1 central and to the left effecting not only the S1 nerve root,  but also the L5.  The right side is completely normal.   CLINICAL IMPRESSION:  Left L5-S1 herniated disk.   RECOMMENDATIONS:  She is being admitted for surgery.  We are going to  attempt to do microsurgery using the Matrix system.  She knows that because  of her size, if we found the length between the skin and the area of the  lamina is too big, then we are going to proceed with a standard L5-S1  diskectomy.  She knows about the risks of either one of the procedures to  include infection, no improvement whatsoever, need for further surgery, and  March 09, 2004CSF leak.                                               Hilda Lias, M.D.    EB/MEDQ  D:  12/15/2002  T:  12/15/2002  Job:  [id number]

## 2011-02-22 NOTE — Op Note (Signed)
NAMELANYIAH, Beth [name]           ACCOUNT NO.:  [id number]   MEDICAL RECORD NO.:  [id number]          PATIENT TYPE:  OIB   LOCATION:  2856                         FACILITY:  [facility]   PHYSICIAN:  Hilda Lias, M.D.   DATE OF BIRTH:  May 02, 1961   DATE OF PROCEDURE:  02/22/2005  DATE OF DISCHARGE:  02/22/2005                                 OPERATIVE REPORT   PREOPERATIVE DIAGNOSIS:  Stitch abscess, rule out fistula.   POSTOPERATIVE DIAGNOSIS:  Stitch abscess, rule out fistula.   PROCEDURE:  1.  Resection of a skin fistula.  2.  Resection of previous Ethicon stitches.   SURGEON:  Hilda Lias, MD.   ANESTHESIA:  IV sedation plus local.   CLINICAL HISTORY:  The patient underwent lumbar surgery several months ago.  The patient is quite sensitive to every single material and allergic to a  lot of sutures including VICRYL, SILK, CATGUT, etc.  We used an Ethicon  suture and the patient did well, but lately she has noticed that in the  middle of the incision she had been draining off and on for several weeks.  There are some days when it is completely dry, but then she starts draining.  I saw her in my office with her husband and because of that we decided to go  ahead and resect either the fistula or the stitch abscess.  The plan was to  leave the wound open to heal by secondary intention.   PROCEDURE:  The patient was taken to the OR and she was positioned in a  prone manner.  Infiltration over the skin in the lumbar area was done with  Xylocaine, plus IV sedation was given by Anesthesia.  The area was prepped  with Betadine.  An oval-shaped type of incision was made bilaterally with  the area in question in the middle.  Dissection of the skin down through  subcutaneous tissue was made.  Indeed, there was no evidence of any purulent  material, but mostly irritation of the skin secondary to the sutures.  The  sutures were removed as well as the skin involved in the fistula.  Then  in  the lower part, there was an area where we could see the blue color of the  suture going through.  An incision was made and  __________was resected.  After that, the area was irrigated and hemostasis was done with bipolar.  We  were planning to leave the wound open until we talked to her and indeed she  is not allergic to steel.  Because of that, we have good hemostasis, and  approximation of the skin edges was made with staples.  The patient is  stable, and she is going to go home and I am going to follow her in my  office.      EB/MEDQ  D:  02/22/2005  T:  02/23/2005  Job:  [id number]
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
## 2011-02-22 NOTE — Op Note (Signed)
   NAME:  Beth [name], WECHTER                     ACCOUNT NO.:  [id number]   MEDICAL RECORD NO.:  [id number]                   PATIENT TYPE:  AMB   LOCATION:  NESC                                 FACILITY:  [facility]   PHYSICIAN:  Lindaann Slough, M.D.               DATE OF BIRTH:  1961-05-31   DATE OF PROCEDURE:  02/04/2003  DATE OF DISCHARGE:                                 OPERATIVE REPORT   PREOPERATIVE DIAGNOSIS:  Meatal stenosis.   POSTOPERATIVE DIAGNOSIS:  Meatal stenosis.   PROCEDURE:  Cystoscopy, hydraulic bladder distention and urethral dilation.   SURGEON:  Lindaann Slough, M.D.   ANESTHESIA:  General.   INDICATIONS FOR PROCEDURE:  The patient is a 50 year old female who has been  complaining of frequency, hesitancy and suprapubic pain. She was treated  with antibiotics without any improvement. The patient had a pubovaginal  sling done about three years ago. Cystoscopy done in the office showed no  erosion of suture material in the bladder. The patient is scheduled today  for cystoscopy under anesthesia with urethral dilation.   DESCRIPTION OF PROCEDURE:  Under general anesthesia, the patient was prepped  and draped and placed in the dorsal lithotomy position. A #22 Wappler  cystoscope was inserted the bladder. The bladder mucosa is normal. There is  no stone or tumor in the bladder. The ureteral orifices are in normal  position and shape with clear efflux. There is no evidence of submucosal  hemorrhage. The bladder was then distended with normal saline and the  bladder capacity is 1200 mL.   The cystoscope was then removed. The urethra was dilated up to a #36 [place].   The patient tolerated the procedure well and left the OR in satisfactory  condition to the post anesthesia care unit.                                               Lindaann Slough, M.D.    MN/MEDQ  D:  02/04/2003  T:  02/04/2003  Job:  [id number]

## 2011-02-22 NOTE — Op Note (Signed)
NAME:  Beth [name], Beth [name]                     ACCOUNT NO.:  [id number]   MEDICAL RECORD NO.:  [id number]                   PATIENT TYPE:  OIB   LOCATION:  3004                                 FACILITY:  [facility]   PHYSICIAN:  Hilda Lias, M.D.                DATE OF BIRTH:  1961-10-06   DATE OF PROCEDURE:  12/15/2002  DATE OF DISCHARGE:                                 OPERATIVE REPORT   PREOPERATIVE DIAGNOSES:  1. Left L5-S1 herniated disk foramina.  2. Obesity.   POSTOPERATIVE DIAGNOSES:  1. Left L5-S1 herniated disk foramina.  2. Obesity.   PROCEDURE:  __________ L5-S1 diskectomy, foraminotomy.  Microscope.  C-arm.   SURGEON:  Hilda Lias, M.D.   ASSISTANT:  Cristi Loron, M.D.   CLINICAL HISTORY:  The patient was admitted because of back and left leg  pain.  The pain is getting worse, and she has weakness of her plantar  flexion of the left foot associated with an absence of the left biceps.  The  patient had conservative treatment elsewhere, including six epidural  injections, without improvement.  Because of the findings, the patient is  explained need for surgery.  The patient knew that we are going to attempt  to do the surgery with the Metrix, but if we have any question about her  size and the size of the instrument, we are going to go ahead with a  standard diskectomy.   DESCRIPTION OF PROCEDURE:  The patient was taken to the OR, and she was  positioned in a prone manner.  The back was prepped with Betadine.  We  brought the C-arm into the area.  It was quite difficult because of the  large hips to find the area between L5-S1.  She has a sixth lumbar vertebra,  so we are talking about the second space from the bottom up.  Because of the  distance between the skin and the spinous process, it was impossible to do  the Metrix system.  Because of that, we did a standard procedure, making a  midline incision from L5 to S1 and retracting the muscle in the left  side.  We brought immediately the microscope into the area, and that was the only  way we were able to retract the muscle laterally.  Retraction was made.  Then we did another x-ray using the standard machine, and it was read by the  radiologist that indeed after he compared with the MRI, we were in the right  area __________ the sacral space from below.  We did a laminotomy, removing  part of the lamina of L4 and L5.  The yellow ligament was also excised.  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  Retraction of  the S1 nerve root was made.  There was a bulging disk, which was mostly  extending laterally.  Incision was made and total gross diskectomy with  quite a bit of degenerative disk laterally.  At the end we have a good  decompression of the L5 and S1 nerve root.  Foraminotomy was done.  Then without any complication, we used Tisseel in  the dural space.  The area was irrigated.  THE PATIENT IS ALLERGIC TO VICRYL  AND SILK.  We closed the fascia as well as the muscle and the supradural  space using simple catgut.  Steri-Strips were used for the skin.                                               Hilda Lias, M.D.    EB/MEDQ  D:  12/15/2002  T:  12/16/2002  Job:  [id number]

## 2011-02-22 NOTE — Discharge Summary (Signed)
Beth [name], Beth [name]           ACCOUNT NO.:  [id number]   MEDICAL RECORD NO.:  [id number]          PATIENT TYPE:  INP   LOCATION:  3033                         FACILITY:  [facility]   PHYSICIAN:  Hilda Lias, M.D.   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   CLINICAL HISTORY:  The patient was admitted to the hospital because of back  pain with radiation to both legs.  The patient previously had a diskectomy.  X-rays show a severe case of degenerative disk disease at the level of 5-1.  Surgery was advanced.   LABORATORY:  Normal.   COURSE IN THE HOSPITAL:  The patient was taken to surgery and a 5-1  diskectomy followed by fusion with pedicle screw was done.  After surgery,  the patient had been complaining of quite a bit of pain and discomfort, but  today, she is feeling much better, she has been able to ambulate and she had  been seen by physical therapy.  She is being discharged to be followed by me  in my office in a week.   MEDICATIONS:  She will continue taking every single medication as preop and  as per the advise of the __________.   DIET:  She was advised to decrease weight.   FOLLOWUP:  I will see her in my office in a week to have the stitches out.       EB/MEDQ  D:  08/02/2004  T:  08/02/2004  Job:  [id number]

## 2011-02-22 NOTE — Letter (Signed)
August 13, 2006    [facility]  [address]  [place], [place] [address]   RE:  Beth [name], Beth [name]  MRN:  [id number]  /  DOB:  05/14/1961   Dear Ms. Ellen Henri:   I find it necessary to inform you that I will not be able to provide medical  care to you because you have missed multiple appointments.   Since your condition requires continued medical attention, I suggest that  you place yourself under the care of another physician without delay.  If  you desire, I will be available for emergency care for the next 30 days  after you receive this letter.   This should give you ample time to select a physician of your choice from  the many competent providers in this area.  You may want to call the local  medical society or [facility] physician's referral service  for their assistance in locating a new physician.  With your written  authorization I will make a copy of your medical records available to your  new physician.    Sincerely,      Barbette Hair. Artist Pais, DO  Electronically Signed    RDY/MedQ  DD: 08/13/2006  DT: 08/13/2006  Job #: [id number]

## 2011-02-22 NOTE — H&P (Signed)
NAMEJOLANA, Beth [name]           ACCOUNT NO.:  [id number]   MEDICAL RECORD NO.:  [id number]          PATIENT TYPE:  INP   LOCATION:  2899                         FACILITY:  [facility]   PHYSICIAN:  Hilda Lias, M.D.   DATE OF BIRTH:  [birth year]   DATE OF ADMISSION:  07/27/2004  DATE OF DISCHARGE:                                HISTORY & PHYSICAL   HISTORY OF PRESENT ILLNESS:  The patient is being admitted today because of  chronic persistent back pain with radiation down to both legs.  This lady,  many years ago, underwent an L5-S1 diskectomy and she did really well, but  lately she has been having back pain with radiation down to both legs which  is getting worse, despite conservative treatment.  The patient had been seen  by the pain clinic.  She had been given medication and epidural injection  with no improvement.  She had an MRI and she was sent to [place] because of  worsening clinically and radiologically.   PAST MEDICAL HISTORY:  1.  Anterior cervical diskectomy.  2.  Laparoscopy.  3.  Foot surgery.  4.  Hysterectomy.  5.  Cholecystectomy.  6.  Bladder surgery.   ALLERGIES:  She is allergic to HORSE SERUM, PEANUTS, TOPICAL BETAMETHASONE,  CODEINE, VICODIN, HYDROCODONE, GLUCOPHAGE, PENICILLIN, SOMA, ULTRAM,  ADVICOR, SHELLFISH, NIACIN.   FAMILY HISTORY:  Family history unremarkable.   SOCIAL HISTORY:  Negative.   REVIEW OF SYSTEMS:  Review of systems positive for back pain with radiation  down to both legs.   PHYSICAL EXAMINATION:  GENERAL:  This is a patient who came to my office  with her husband.  She had difficulty sitting and standing.  She was walking  with short steps.  HEENT/NECK:  There is a scar anterior from previous surgery.  She has  minimal decreased flexion of the cervical spine.  LUNGS:  Clear.  HEART:  Heart sounds normal.  ABDOMEN:  Normal.  EXTREMITIES:  Normal pulses.  She has a small scar in the paralumbar area.  NEUROLOGIC:  Mental status  normal.  Cranial nerves normal.  Reflexes 1+.  Sensation:  She complains of numbness in both feet.  Straight leg raising is  positive bilaterally at __________ 20 degrees.  She has had decreased  flexion in the lumbar spine.   IMAGING STUDIES:  The lumbar MRI showed a severe case of degenerative disk  disease at the level of 5-1 which when I compared with the previous one is  worse.   She has a borderline sed rate of 39 with a C-reactive protein of 1.0.   CLINICAL IMPRESSION:  Degenerative disk disease at L5-S1, worse clinically  and radiologically.   RECOMMENDATION:  The patient is going to be admitted for L5-S1 diskectomy,  interbody fusion with pedicle screws.  The risks of course are the  possibility that when we get into the space we will find infection, which at  that time we are going to stop the procedure.  Nevertheless, if we continue  with the procedure, the risks are infection, CSF lead, no improvement  whatsoever, need for further surgery,  damage to the nerve and to the vessels  of the abdomen.       EB/MEDQ  D:  07/27/2004  T:  07/27/2004  Job:  [id number]
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
## 2011-02-22 NOTE — Assessment & Plan Note (Signed)
[facility]                             PRIMARY CARE OFFICE NOTE   NAME:[name], [name]                  MRN:          [id number]  DATE:05/19/2006                            DOB:          1960-10-25    CHIEF COMPLAINT:  New patient to practice.   HISTORY OF PRESENT ILLNESS:  The patient is a 50 year old white female with  a complex medical history, here to establish primary care.  She was  originally a patient of Dr. Ruthine Dose, met with Dr. Birdie Sons, did not feel  comfortable, and would like to transfer to our office.  The patient has a  complex medical history including type 2 diabetes, diet controlled, morbid  obesity, elevated cholesterol, asthma with obstructive sleep apnea and  fibromyalgia.  The patient states that over the last several months she has  seen several specialists.  She was quite concerned that she did have  Parkinson's disease.  She was seen by Dr. Sandria Manly, neurologist, who did not  agree with that suspicion and did not find any evidence for Parkinson's  disease.  She has had ongoing issues with intermittent urinary incontinence.  She states that she often does not feel when her bladder is full and has  issues with incontinence.  This has been ongoing for several months.  She  has an appointment with a urologist for followup.  She denies any neurologic  symptoms, but has chronic back pain and neck pain.  She is status post  cervical fusion, C4 through C7, in September of 2002.  She is also status  post screws and rods in her lower back at L5 in October of 2005.  The  patient states that due to her multiple orthopedic problems, she is on  disability since 2001.   OTHER ACTIVE ISSUES:  Uncontrolled gastroesophageal reflux disease.  She was  previously on large doses of Protonix 40 mg two tablets in the morning and  two tablets at bedtime.  She underwent evaluation by Dr. Stan Head, and  underwent EGD in July of 2007.  Her  exam revealed a hiatal hernia,  unspecified gastritis, and some food retention, and biopsies were taken to  see if there was any microscopic esophagitis/eosinophilic esophagitis.  Pathology report showed reactive gastropathy, benign squamous mucosa of the  esophagus.  No esophagitis or intestinal metaplasia or malignancy.   PAST MEDICAL HISTORY SUMMARY:  1. Morbid obesity.  2. Gastroesophageal reflux disease.  3. Type 2 diabetes for 7 years, controlled with diet.  4. History of obstructive sleep apnea and asthma, followed by Dr. Shelle Iron.  5. Elevated cholesterol.  6. Urinary incontinence.  7. Status post cholecystectomy in March of 2000.  8. Status post hysterectomy in December of 1999,  9. Status post cervical fusion, C4 through C7, in September of 2002.  10.Status post L5 back surgery with screws and rods in October of 2005.  11.Right ankle surgery in December of 2006.  12.Status post bladder tack in the year 2000.   CURRENT MEDICATIONS:  1. Advair HFA 115/21 two puffs b.i.d.  2. Halcion 25 mg two q.h.s.  3. AcipHex  20 mg b.i.d.  4. Zanaflex three in the morning, one at noon and three q.h.s.  5. Cymbalta 20 mg once a day.  6. Requip 1 mg once a day.  7. Roxicodone 5 mg 1-2 tablets q.6h. as prescribed by Dr. Vear Clock, her      pain management specialist.  8. Pravastatin 20 mg h.s.  9. Lyrica 150 mg b.i.d.  10.GenTeal gel 3-4 a day.  11.Nystatin/triamcinolone to vaginal area p.r.n.  12.Albuterol q.4h. p.r.n.  13.Compazine p.r.n.  14.Phenergan p.r.n.  15.Flexeril 10 mg p.r.n. q.h.s.   ALLERGIES TO MEDICATIONS:  1. BETAMETHASONE.  2. CODEINE.  3. VICODIN.  4. HYDROCODONE.  5. GLUCOPHAGE.  6. PENICILLIN which causes anaphylaxis.  7. SOMA.  8. ULTRAM.  9. ADVICOR.  10.HORSE SERUM.  11.PEANUTS.  12.SHELLFISH.   SOCIAL HISTORY:  The patient is married.  She has 1 child.  The first child  decreased, reported by patient secondary to homicide.  Her second child is  62  years old.  She lives with her husband.  She is a retired Firefighter.   FAMILY HISTORY:  The patient was adopted.   HABITS:  Occasional alcohol.  Quit tobacco 15-16 years ago.  Smoked 5-6  years.  She was a light smoker of 1 pack every week.   PREVENTATIVE CARE HISTORY:  Her last Pap was in 2006, as well as her last  mammogram.  Her last colonoscopy was in 2003.   REVIEW OF SYSTEMS:  No fevers or chills.  Mild weight loss since her last  visit with a gastroenterologist of approximately 4 pounds.  Denies any chest  pain.  Breathing is fairly stable.  GU symptoms are noted above.  She also  apparently had some thyroid studies performed by Dr. Sandria Manly.  The patient does  not recall the exact abnormality, whether she was hyper or hypothyroid.  All  other systems negative.   PHYSICAL EXAMINATION:  VITAL SIGNS:  Weight is 300 pounds.  Temperature is  97.9, pulse 106, blood pressure 130/80 in the left arm in the seated  position.  GENERAL:  The patient is a pleasant but morbidly obese 50 year old white  female in no apparent distress.  HEENT:  Normocephalic and atraumatic.  Pupils equal, round and reactive to  light bilaterally.  Extraocular muscles were intact.  The patient was  anicteric.  Conjunctivae was within normal limits.  External auditory canals  and tympanic membranes are clear bilaterally.  Hearing is grossly normal.  Oropharynx is unremarkable.  NECK:  Left-sided anterior scar approximately 3-4 inches from previous neck  surgery.  No carotid bruit appreciated.  No thyroid abnormality.  CHEST:  Normal respiratory effort.  Chest was clear to auscultation  bilaterally.  No rhonchi, rales or wheezing.  CARDIOVASCULAR:  Regular rate and rhythm.  No significant murmurs, rubs, or  gallops appreciated.  ABDOMEN:  Protuberant and nontender.  Positive bowel sounds.  No  organomegaly. MUSCULOSKELETAL:  No clubbing, cyanosis, or edema.  SKIN:  Warm and dry.  The patient had pedis  dorsalis pulses, equal and  symmetric.  NEUROLOGIC:  Cranial nerves II-XII were grossly intact.  She was nonfocal.  There was no evidence of masked facies or bradykinesia.  Muscle tone was  normal.   IMPRESSION/RECOMMENDATIONS:  1. Intermittent urinary incontinence, possible neurogenic bladder of      unclear etiology.  2. Report of abnormal thyroid studies.  3. History of asthma.  4. Severe obstructive sleep apnea.  5. Type 2 diabetes, diet controlled.  6. History of fibromyalgia.  7. Gastroesophageal reflux disease.  8. Dyslipidemia.  9. Morbid obesity.  10.Health maintenance.   RECOMMENDATIONS:  The patient is to followup with Dr. Brunilda Payor regarding work-up  for urinary incontinence/neurogenic bladder.  She will likely need bladder  manometry.  I refilled her pravastatin today, and also switched her from  AcipHex back to Protonix, except at 40 mg b.i.d.  Apparently, she feels  AcipHex is not controlling her gastroesophageal reflux disease.  She may  have some component for gastroparesis, especially with retained food in the  stomach on her last EGD.  We will consider ordering a gastric emptying  study.   She is due for her labs, checking her hemoglobin A1C, as well as liver  function tests and lipid panel.  This will be performed today, for she is  fasting, and we will repeat her thyroid studies, checking free T3, T4, as  well as TSH.  Followup time will be in approximately 1 month.                                   Barbette Hair. Artist Pais, DO   RDY/MedQ  DD:  05/19/2006  DT:  05/20/2006  Job #:  [id number]

## 2011-02-22 NOTE — Assessment & Plan Note (Signed)
[facility]                              CARDIOLOGY OFFICE NOTE   NAME:Beth [name], Beth [name]                  MRN:          [id number]  DATE:06/13/2006                            DOB:          May 04, 1961    Beth [name] returns today for followup.  She has multiple medical problems.  Fortunately, her heart seems to be fine.  She has atypical chest pain in the  past.   She has had chronic pain syndrome.  She has sleep apnea, and has a sleep  disorder.  She apparently has been seen by a psychiatrist in the past.  She  is on multiple medications, both for pain and depression and sleep.   She continues to be overweight.   When talking to her, she denies being depressed.   She is fairly sedentary.  She goes to the movies a lot.  She had one son who  died about 7 years ago, and another son who is at school at [employer], who  does not talk to her much.   In regards to her review of systems, she has been compliant with her  Pravachol for her hypercholesterolemia.   She is to see Dr. Shelle Iron next week to see about seeing a sleep specialist.   She is on multiple medications listed in the chart.   PHYSICAL EXAMINATION:  She is overweight.  Her blood pressure is 128/78,  pulse is 80 and regular.  LUNGS:  Clear.  Carotids are normal.  She has an anterior cervical scar on the left side  from a spinal surgery.  There is an S1 and S2 with normal heart tones.  ABDOMEN:  Benign.  LOWER EXTREMITIES:  Intact pulses, no edema.   EKG is normal.   IMPRESSION:  1. Previous atypical chest pain.  2. Hypercholesterolemia.  3. Normal EKG.  4. No recurrent chest pain.  5. Multiple other chronic pain problems.  6. Sleep disorder.   It is unfortunate, but Beth [name] appears somewhat incapacitated by these multiple  issues regarding sleep and pain.  I am not sure that there is an easy  solution to this.  She will follow up with her primary care doctors and Dr.  Shelle Iron.  I will  see her back in a year.                               Noralyn Pick. Eden Emms, MD, [facility]    PCN/MedQ  DD:  06/13/2006  DT:  06/14/2006  Job #:  [id number]

## 2011-02-22 NOTE — Assessment & Plan Note (Signed)
[facility]                               PULMONARY OFFICE NOTE   NAME:[name] [name] PAKULA                  MRN:          [id number]  DATE:07/22/2006                            DOB:          July 10, 1961    HISTORY OF PRESENT ILLNESS:  The patient is a 50 year old white female  patient of Dr. Shelle Iron who has a known history of obstructive sleep apnea and  recurrent asthmatic bronchitis who presents for an acute office visit. The  patient complains of a five day history of a productive cough, nasal  congestion, hoarseness, and severe coughing paroxysms. The patient denies  any hemoptysis, chest pain, orthopnea, paroxysmal nocturnal dyspnea or leg  swelling.   PAST MEDICAL HISTORY:  Reviewed.   CURRENT MEDICATIONS:  Reviewed.   PHYSICAL EXAMINATION:  The patient is morbidly obese female in no acute  distress. She is afebrile with stable vital signs. O2 saturation is 94% on  room air.  HEENT: Nasal mucosa is erythematous. Sinus area is __________.  Posterior  pharynx is clear. Neck is supple without adenopathy. No JVD.  LUNGS: Lung sounds reveal coarse breath sounds without any wheezes or  crackles.  CARDIAC: Regular rate and rhythm.  ABDOMEN: Soft and benign.  EXTREMITIES: Warm without any edema.   IMPRESSION/PLAN:  Acute tracheal bronchitis with questionable early  sinusitis. The patient is to begin Levaquin 750 mg x 5 days. The patient  reports she has multiple drug allergies and is intolerable to Mucinex DM and  multiple antibiotics. She does report she can take quinolones. The patient  may also use Tessalon Perles as needed. The patient should return with Dr.  Shelle Iron as scheduled or sooner if needed.      ______________________________  Rubye Oaks, NP    ______________________________  Barbaraann Share, MD,FCCP     TP/MedQ  DD:  07/22/2006  DT:  07/23/2006  Job #:  [id number]

## 2011-02-22 NOTE — Op Note (Signed)
[place]  Patient:    Beth [name], Beth [name]                    MRN: [id number] Proc. Date: 11/12/00 Adm. Date:  [id number] Attending:  Stephenie Acres                           Operative Report  PREOPERATIVE DIAGNOSIS:  Left breast mass.  POSTOPERATIVE DIAGNOSIS:  Left breast mass.  OPERATION PERFORMED:  Left breast biopsy.  SURGEON:  Stephenie Acres, M.D.  ANESTHESIA:  Local MAC.  DESCRIPTION OF PROCEDURE:  The patient was taken to the operating room and placed in supine position.  After adequate anesthesia was induced using MAC technique, the left breast was prepped and draped in normal sterile fashion. Using 1% lidocaine local anesthesia, the skin overlying the 12 oclock region of the periareolar portion of the left breast was anesthetized.  A curvilinear incision was made dissecting down into the small palpable mass which was delivered up into the wound and excised in its entirety.  Adequate hemostasis was ensured and the skin was closed with a subcuticular 4-0 Monocryl. Steri-Strips and sterile dressing were applied.  The patient tolerated the procedure well and went to PACU in good condition. DD:  11/12/00 TD:  11/12/00 Job: [id number]

## 2011-02-22 NOTE — Discharge Summary (Signed)
[place]  Patient:    KOURTNIE, SACHS Visit Number: [id number] MRN: [id number]          Service Type: SUR Location: 3000 3040 01 Attending Physician:  Danella Penton Admit Date:  06/17/2001 Disc. Date: 06/20/01                             Discharge Summary  ADMITTING DIAGNOSIS:  Cervical spondylosis at 4-5, C5-6, C6-C7.  DISCHARGE DIAGNOSIS:  Cervical spondylosis at 4-5, C5-6, C6-C7.  OPERATIVE PROCEDURE:  Anterior cervicectomy and fusion C4-5, C5-6, and C6-C7.  ATTENDING:  Tanya Nones. Jeral Fruit, M.D.  COMPLICATIONS:  None.  DISCHARGE STATUS:  Alive and well.  BRIEF HISTORY:  This is a 50 year old, right-handed while lady whose history and physical is recounted in the chart.  She had been having neck pain, difficulty working.  She had pain is the upper extremities, difficulty with her legs shaking when she was trying to drive.  She visited with Dr. Jeral Fruit, and an MRI demonstrated some cord compression.  She was admitted for fusion.  PAST MEDICAL HISTORY: 1. Total laparoscopy. 2. Foot surgery. 3. Endoscopy. 4. Hysterectomy. 5. Cholecystectomy. 6. Bladder surgery.  ALLERGIES:  PENICILLIN, PREDNISONE, ULTRAM, CODEINE, HYDROCODONE, OXYCONTIN, WELLBUTRIN.  SOCIAL HISTORY:  Does not smoke, does not drink.  REVIEW OF SYSTEMS:  Remarkable for everything.  GENERAL PHYSICAL EXAMINATION:  Intact neurologically, had weakness in both biceps and wrist extensors as well as weakness in the right triceps.  Babinski is negative .  Lower extremities are nonmyelopathic.  She has difficulty walking a straight line.  HOSPITAL COURSE:  She was admitted after ascertaining normal laboratory values and underwent an anterior cervicectomy and fusion at C4-5, C5-6, and C6-C7. Postoperatively, she did reasonably well.  She had complaints of pain in her neck and some itching in her incision.  She also had some constitutional complaints of  headache, needing some laxative.  Basically she has been able to get up and walk, void, take care of herself.  Her strength is full to confrontational exam.  Incision is dry.  She is using Demerol every three hours.  I told her when she went home, she could only use it every four.  She wanted to use Toradol.  I told her p.o. Toradol was not a great idea for people with a tendency to take a lot of pills because of the possibility of GI bleeding.  She knows to use ibuprofen that she can get over-the-counter.  FOLLOW-UP:  With Dr. Jeral Fruit by a phone call on Monday to arrange an appointment. Attending Physician:  Danella Penton DD:  06/20/01 TD:  06/20/01 Job: [id number]

## 2011-04-19 ENCOUNTER — Encounter: Payer: Self-pay | Admitting: Internal Medicine

## 2011-07-01 LAB — COMPREHENSIVE METABOLIC PANEL
ALT: 21
AST: 23
Alkaline Phosphatase: 91
CO2: 31
Chloride: 100
GFR calc Af Amer: >60
GFR calc non Af Amer: >60
Sodium: 137
Total Bilirubin: 0.4

## 2011-07-01 LAB — CBC
Hemoglobin: 11.7 — ABNORMAL LOW
Hemoglobin: 12
MCV: 87.6
RBC: 3.87
RBC: 4.1
RDW: 14.3
WBC: 12.5 — ABNORMAL HIGH
WBC: 9.9

## 2011-07-01 LAB — BASIC METABOLIC PANEL
Calcium: 8.4
GFR calc Af Amer: >60
GFR calc non Af Amer: >60
Potassium: 4.6
Sodium: 136

## 2011-07-08 LAB — CBC
HCT: 36.7
Hemoglobin: 12.3
MCV: 85.3
RDW: 13.5

## 2011-07-08 LAB — DIFFERENTIAL
Basophils Absolute: 0
Eosinophils Absolute: 0.3
Eosinophils Relative: 3
Lymphocytes Relative: 31
Lymphs Abs: 2.3
Monocytes Absolute: 0.5

## 2011-07-08 LAB — WOUND CULTURE

## 2011-07-08 LAB — BASIC METABOLIC PANEL
Chloride: 102
GFR calc non Af Amer: >60
Glucose, Bld: 150 — ABNORMAL HIGH
Potassium: 3.5
Sodium: 135

## 2011-07-12 LAB — DIFFERENTIAL
Basophils Relative: 1 % (ref 0–1)
Lymphs Abs: 2.8 10*3/uL (ref 0.7–4.0)
Monocytes Relative: 7 % (ref 3–12)
Neutro Abs: 3.8 10*3/uL (ref 1.7–7.7)
Neutrophils Relative %: 51 % (ref 43–77)

## 2011-07-12 LAB — CBC
HCT: 34.8 % — ABNORMAL LOW (ref 36.0–46.0)
Hemoglobin: 11.6 g/dL — ABNORMAL LOW (ref 12.0–15.0)
MCHC: 33.3 g/dL (ref 30.0–36.0)
RDW: 14.9 % (ref 11.5–15.5)

## 2011-07-12 LAB — COMPREHENSIVE METABOLIC PANEL
BUN: 5 mg/dL — ABNORMAL LOW (ref 6–23)
Calcium: 8.9 mg/dL (ref 8.4–10.5)
Creatinine, Ser: 0.66 mg/dL (ref 0.4–1.2)
Glucose, Bld: 103 mg/dL — ABNORMAL HIGH (ref 70–99)
Total Protein: 6.8 g/dL (ref 6.0–8.3)

## 2011-07-12 LAB — GLUCOSE, CAPILLARY: Glucose-Capillary: 145 mg/dL — ABNORMAL HIGH (ref 70–99)

## 2012-06-29 ENCOUNTER — Encounter: Payer: Self-pay | Admitting: Internal Medicine

## 2012-10-22 ENCOUNTER — Encounter: Payer: Self-pay | Admitting: Internal Medicine

## 2018-01-13 ENCOUNTER — Emergency Department: Payer: Managed Care, Other (non HMO)

## 2018-01-13 ENCOUNTER — Observation Stay
Admission: EM | Admit: 2018-01-13 | Discharge: 2018-01-16 | Disposition: A | Discharge status: Home / Self Care | Payer: Managed Care, Other (non HMO) | Attending: Internal Medicine | Admitting: Internal Medicine

## 2018-01-13 ENCOUNTER — Encounter: Payer: Self-pay | Admitting: Emergency Medicine

## 2018-01-13 ENCOUNTER — Other Ambulatory Visit: Payer: Self-pay

## 2018-01-13 DIAGNOSIS — Z7951 Long term (current) use of inhaled steroids: Secondary | ICD-10-CM | POA: Diagnosis not present

## 2018-01-13 DIAGNOSIS — Z800 Family history of malignant neoplasm of digestive organs: Secondary | ICD-10-CM | POA: Diagnosis not present

## 2018-01-13 DIAGNOSIS — G4733 Obstructive sleep apnea (adult) (pediatric): Secondary | ICD-10-CM | POA: Diagnosis not present

## 2018-01-13 DIAGNOSIS — K922 Gastrointestinal hemorrhage, unspecified: Secondary | ICD-10-CM | POA: Insufficient documentation

## 2018-01-13 DIAGNOSIS — E119 Type 2 diabetes mellitus without complications: Secondary | ICD-10-CM | POA: Insufficient documentation

## 2018-01-13 DIAGNOSIS — N3281 Overactive bladder: Secondary | ICD-10-CM | POA: Diagnosis not present

## 2018-01-13 DIAGNOSIS — F329 Major depressive disorder, single episode, unspecified: Secondary | ICD-10-CM | POA: Insufficient documentation

## 2018-01-13 DIAGNOSIS — G43909 Migraine, unspecified, not intractable, without status migrainosus: Secondary | ICD-10-CM | POA: Diagnosis not present

## 2018-01-13 DIAGNOSIS — I10 Essential (primary) hypertension: Secondary | ICD-10-CM | POA: Insufficient documentation

## 2018-01-13 DIAGNOSIS — R109 Unspecified abdominal pain: Secondary | ICD-10-CM | POA: Diagnosis present

## 2018-01-13 DIAGNOSIS — M797 Fibromyalgia: Secondary | ICD-10-CM | POA: Insufficient documentation

## 2018-01-13 DIAGNOSIS — K219 Gastro-esophageal reflux disease without esophagitis: Secondary | ICD-10-CM | POA: Insufficient documentation

## 2018-01-13 DIAGNOSIS — K582 Mixed irritable bowel syndrome: Secondary | ICD-10-CM | POA: Insufficient documentation

## 2018-01-13 DIAGNOSIS — Z6841 Body Mass Index (BMI) 40.0 and over, adult: Secondary | ICD-10-CM | POA: Diagnosis not present

## 2018-01-13 DIAGNOSIS — J45909 Unspecified asthma, uncomplicated: Secondary | ICD-10-CM | POA: Diagnosis not present

## 2018-01-13 DIAGNOSIS — Z79899 Other long term (current) drug therapy: Secondary | ICD-10-CM | POA: Insufficient documentation

## 2018-01-13 DIAGNOSIS — R1032 Left lower quadrant pain: Principal | ICD-10-CM | POA: Insufficient documentation

## 2018-01-13 HISTORY — DX: Unspecified asthma, uncomplicated: J45.909

## 2018-01-13 HISTORY — DX: Type 2 diabetes mellitus without complications: E11.9

## 2018-01-13 LAB — URINALYSIS, ROUTINE W REFLEX MICROSCOPIC
BILIRUBIN URINE: NEGATIVE
Glucose, UA: NEGATIVE mg/dL
HGB URINE DIPSTICK: NEGATIVE
Ketones, ur: NEGATIVE mg/dL
Leukocytes, UA: NEGATIVE
NITRITE: NEGATIVE
PROTEIN: NEGATIVE mg/dL
Specific Gravity, Urine: 1.03 (ref 1.005–1.030)
pH: 6 (ref 5.0–8.0)

## 2018-01-13 LAB — COMPREHENSIVE METABOLIC PANEL
ALBUMIN: 3.8 g/dL (ref 3.5–5.0)
ALT: 25 U/L (ref 14–54)
AST: 33 U/L (ref 15–41)
Alkaline Phosphatase: 93 U/L (ref 38–126)
Anion gap: 7 (ref 5–15)
BUN: 12 mg/dL (ref 6–20)
CO2: 26 mmol/L (ref 22–32)
Calcium: 9.1 mg/dL (ref 8.9–10.3)
Chloride: 103 mmol/L (ref 101–111)
Creatinine, Ser: 0.63 mg/dL (ref 0.44–1.00)
GFR calc Af Amer: >60 mL/min (ref >60)
Glucose, Bld: 140 mg/dL — ABNORMAL HIGH (ref 65–99)
POTASSIUM: 3.8 mmol/L (ref 3.5–5.1)
SODIUM: 136 mmol/L (ref 135–145)
Total Bilirubin: 0.8 mg/dL (ref 0.3–1.2)
Total Protein: 8.3 g/dL — ABNORMAL HIGH (ref 6.5–8.1)

## 2018-01-13 LAB — CBC
HCT: 42.9 % (ref 35.0–47.0)
Hemoglobin: 14.4 g/dL (ref 12.0–16.0)
MCH: 29.8 pg (ref 26.0–34.0)
MCHC: 33.5 g/dL (ref 32.0–36.0)
MCV: 89 fL (ref 80.0–100.0)
PLATELETS: 269 10*3/uL (ref 150–440)
RBC: 4.83 MIL/uL (ref 3.80–5.20)
RDW: 13.9 % (ref 11.5–14.5)
WBC: 10.8 10*3/uL (ref 3.6–11.0)

## 2018-01-13 LAB — GASTROINTESTINAL PANEL BY PCR, STOOL (REPLACES STOOL CULTURE)
Adenovirus F40/41: NOT DETECTED
Astrovirus: NOT DETECTED
CAMPYLOBACTER SPECIES: NOT DETECTED
Cryptosporidium: NOT DETECTED
Cyclospora cayetanensis: NOT DETECTED
ENTAMOEBA HISTOLYTICA: NOT DETECTED
ENTEROTOXIGENIC E COLI (ETEC): NOT DETECTED
Enteroaggregative E coli (EAEC): NOT DETECTED
Enteropathogenic E coli (EPEC): NOT DETECTED
Giardia lamblia: NOT DETECTED
NOROVIRUS GI/GII: NOT DETECTED
PLESIMONAS SHIGELLOIDES: NOT DETECTED
ROTAVIRUS A: NOT DETECTED
SALMONELLA SPECIES: NOT DETECTED
SAPOVIRUS (I, II, IV, AND V): NOT DETECTED
SHIGA LIKE TOXIN PRODUCING E COLI (STEC): NOT DETECTED
SHIGELLA/ENTEROINVASIVE E COLI (EIEC): NOT DETECTED
Vibrio cholerae: NOT DETECTED
Vibrio species: NOT DETECTED
Yersinia enterocolitica: NOT DETECTED

## 2018-01-13 LAB — TYPE AND SCREEN
ABO/RH(D): A NEG
Antibody Screen: NEGATIVE

## 2018-01-13 LAB — C DIFFICILE QUICK SCREEN W PCR REFLEX
C DIFFICILE (CDIFF) INTERP: NOT DETECTED
C DIFFICILE (CDIFF) TOXIN: NEGATIVE
C Diff antigen: NEGATIVE

## 2018-01-13 LAB — LIPASE, BLOOD: LIPASE: 21 U/L (ref 11–51)

## 2018-01-13 LAB — HEMOGLOBIN AND HEMATOCRIT, BLOOD
HCT: 39.1 % (ref 35.0–47.0)
HEMOGLOBIN: 13.2 g/dL (ref 12.0–16.0)

## 2018-01-13 LAB — GLUCOSE, CAPILLARY
GLUCOSE-CAPILLARY: 96 mg/dL (ref 65–99)
GLUCOSE-CAPILLARY: 113 mg/dL — AB (ref 65–99)

## 2018-01-13 MED ORDER — OXYBUTYNIN CHLORIDE ER 5 MG PO TB24
15.0000 mg | ORAL_TABLET | Freq: Every day | ORAL | Status: DC
  Administered 2018-01-14 – 2018-01-16 (×3): 15 mg via ORAL
  Filled 2018-01-13: qty 1
  Filled 2018-01-13 (×4): qty 3

## 2018-01-13 MED ORDER — DICYCLOMINE HCL 10 MG PO CAPS
20.0000 mg | ORAL_CAPSULE | Freq: Two times a day (BID) | ORAL | Status: DC
  Administered 2018-01-13 – 2018-01-16 (×6): 20 mg via ORAL
  Filled 2018-01-13 (×7): qty 2

## 2018-01-13 MED ORDER — PRAVASTATIN SODIUM 20 MG PO TABS
40.0000 mg | ORAL_TABLET | Freq: Every day | ORAL | Status: DC
  Administered 2018-01-13 – 2018-01-15 (×3): 40 mg via ORAL
  Filled 2018-01-13 (×3): qty 2

## 2018-01-13 MED ORDER — GABAPENTIN 300 MG PO CAPS
300.0000 mg | ORAL_CAPSULE | Freq: Three times a day (TID) | ORAL | Status: DC | PRN
  Administered 2018-01-13: 300 mg via ORAL
  Filled 2018-01-13: qty 1

## 2018-01-13 MED ORDER — ONDANSETRON HCL 4 MG PO TABS: 4.0000 mg | ORAL_TABLET | Freq: Four times a day (QID) | ORAL | Status: DC | PRN

## 2018-01-13 MED ORDER — ORAL CARE MOUTH RINSE
15.0000 mL | Freq: Two times a day (BID) | OROMUCOSAL | Status: DC
  Administered 2018-01-13 – 2018-01-14 (×2): 15 mL via OROMUCOSAL

## 2018-01-13 MED ORDER — METRONIDAZOLE 500 MG PO TABS
500.0000 mg | ORAL_TABLET | Freq: Once | ORAL | Status: AC
  Administered 2018-01-13: 500 mg via ORAL
  Filled 2018-01-13: qty 1

## 2018-01-13 MED ORDER — CYCLOBENZAPRINE HCL 10 MG PO TABS
10.0000 mg | ORAL_TABLET | Freq: Three times a day (TID) | ORAL | Status: DC | PRN
  Administered 2018-01-13: 18:00:00 10 mg via ORAL
  Filled 2018-01-13: qty 1

## 2018-01-13 MED ORDER — INSULIN ASPART 100 UNIT/ML ~~LOC~~ SOLN: 0.0000 [IU] | Freq: Three times a day (TID) | SUBCUTANEOUS | Status: DC

## 2018-01-13 MED ORDER — TRAZODONE HCL 50 MG PO TABS
300.0000 mg | ORAL_TABLET | Freq: Every day | ORAL | Status: DC
Start: 2018-01-13 — End: 2018-01-16
  Administered 2018-01-13 – 2018-01-15 (×3): 300 mg via ORAL
  Filled 2018-01-13 (×3): qty 6

## 2018-01-13 MED ORDER — LACTATED RINGERS IV SOLN
INTRAVENOUS | Status: DC
  Administered 2018-01-13 – 2018-01-14 (×2): via INTRAVENOUS

## 2018-01-13 MED ORDER — CIPROFLOXACIN HCL 500 MG PO TABS
500.0000 mg | ORAL_TABLET | Freq: Once | ORAL | Status: AC
  Administered 2018-01-13: 500 mg via ORAL
  Filled 2018-01-13: qty 1

## 2018-01-13 MED ORDER — SODIUM CHLORIDE 0.9 % IV BOLUS
1000.0000 mL | Freq: Once | INTRAVENOUS | Status: AC
  Administered 2018-01-13: 1000 mL via INTRAVENOUS

## 2018-01-13 MED ORDER — MONTELUKAST SODIUM 10 MG PO TABS
10.0000 mg | ORAL_TABLET | Freq: Every day | ORAL | Status: DC
  Administered 2018-01-13 – 2018-01-15 (×3): 10 mg via ORAL
  Filled 2018-01-13 (×3): qty 1

## 2018-01-13 MED ORDER — ONDANSETRON HCL 4 MG/2ML IJ SOLN
4.0000 mg | Freq: Once | INTRAMUSCULAR | Status: AC
  Administered 2018-01-13: 4 mg via INTRAVENOUS
  Filled 2018-01-13: qty 2

## 2018-01-13 MED ORDER — HYDROXYCHLOROQUINE SULFATE 200 MG PO TABS
200.0000 mg | ORAL_TABLET | Freq: Every day | ORAL | Status: DC
  Administered 2018-01-14 – 2018-01-16 (×3): 200 mg via ORAL
  Filled 2018-01-13 (×4): qty 1

## 2018-01-13 MED ORDER — ALBUTEROL SULFATE (2.5 MG/3ML) 0.083% IN NEBU: 2.5000 mg | INHALATION_SOLUTION | RESPIRATORY_TRACT | Status: DC | PRN

## 2018-01-13 MED ORDER — INSULIN ASPART 100 UNIT/ML ~~LOC~~ SOLN: 0.0000 [IU] | Freq: Every day | SUBCUTANEOUS | Status: DC

## 2018-01-13 MED ORDER — RISAQUAD PO CAPS
ORAL_CAPSULE | Freq: Every day | ORAL | Status: DC
  Administered 2018-01-13 – 2018-01-16 (×4): 1 via ORAL
  Filled 2018-01-13 (×4): qty 1

## 2018-01-13 MED ORDER — MOMETASONE FURO-FORMOTEROL FUM 200-5 MCG/ACT IN AERO
2.0000 | INHALATION_SPRAY | Freq: Two times a day (BID) | RESPIRATORY_TRACT | Status: DC
  Administered 2018-01-14 – 2018-01-16 (×4): 2 via RESPIRATORY_TRACT
  Filled 2018-01-13: qty 8.8

## 2018-01-13 MED ORDER — AMLODIPINE BESYLATE 5 MG PO TABS
5.0000 mg | ORAL_TABLET | Freq: Every day | ORAL | Status: DC
  Administered 2018-01-13 – 2018-01-16 (×4): 5 mg via ORAL
  Filled 2018-01-13 (×4): qty 1

## 2018-01-13 MED ORDER — IOPAMIDOL (ISOVUE-300) INJECTION 61%
100.0000 mL | Freq: Once | INTRAVENOUS | Status: AC | PRN
  Administered 2018-01-13: 100 mL via INTRAVENOUS

## 2018-01-13 MED ORDER — HYDROMORPHONE HCL 1 MG/ML IJ SOLN
1.0000 mg | Freq: Once | INTRAMUSCULAR | Status: AC
  Administered 2018-01-13: 1 mg via INTRAVENOUS

## 2018-01-13 MED ORDER — ONDANSETRON HCL 4 MG/2ML IJ SOLN
4.0000 mg | Freq: Four times a day (QID) | INTRAMUSCULAR | Status: DC | PRN
  Administered 2018-01-15: 08:00:00 4 mg via INTRAVENOUS
  Filled 2018-01-13: qty 2

## 2018-01-13 MED ORDER — AMITRIPTYLINE HCL 25 MG PO TABS: 50.0000 mg | ORAL_TABLET | Freq: Every evening | ORAL | Status: DC | PRN

## 2018-01-13 MED ORDER — IOPAMIDOL (ISOVUE-300) INJECTION 61%
30.0000 mL | Freq: Once | INTRAVENOUS | Status: AC | PRN
  Administered 2018-01-13: 30 mL via ORAL

## 2018-01-13 MED ORDER — CIPROFLOXACIN HCL 500 MG PO TABS
500.0000 mg | ORAL_TABLET | Freq: Two times a day (BID) | ORAL | Status: DC
  Administered 2018-01-13 – 2018-01-16 (×6): 500 mg via ORAL
  Filled 2018-01-13 (×6): qty 1

## 2018-01-13 MED ORDER — METRONIDAZOLE IN NACL 5-0.79 MG/ML-% IV SOLN
500.0000 mg | Freq: Two times a day (BID) | INTRAVENOUS | Status: DC
  Administered 2018-01-13 – 2018-01-15 (×4): 500 mg via INTRAVENOUS
  Filled 2018-01-13 (×6): qty 100

## 2018-01-13 MED ORDER — HYDROMORPHONE HCL 1 MG/ML IJ SOLN
INTRAMUSCULAR | Status: AC
  Filled 2018-01-13: qty 1

## 2018-01-13 MED ORDER — ALBUTEROL SULFATE HFA 108 (90 BASE) MCG/ACT IN AERS: 2.0000 | INHALATION_SPRAY | RESPIRATORY_TRACT | Status: DC | PRN

## 2018-01-13 MED ORDER — SODIUM CHLORIDE 0.9 % IV SOLN
80.0000 mg | Freq: Once | INTRAVENOUS | Status: AC
  Administered 2018-01-13: 18:00:00 80 mg via INTRAVENOUS
  Filled 2018-01-13: qty 80

## 2018-01-13 MED ORDER — MILNACIPRAN HCL 50 MG PO TABS
50.0000 mg | ORAL_TABLET | Freq: Two times a day (BID) | ORAL | Status: DC
  Administered 2018-01-14 – 2018-01-16 (×4): 50 mg via ORAL
  Filled 2018-01-13 (×8): qty 1

## 2018-01-13 MED ORDER — LORATADINE 10 MG PO TABS
10.0000 mg | ORAL_TABLET | Freq: Every day | ORAL | Status: DC
  Administered 2018-01-14 – 2018-01-15 (×2): 10 mg via ORAL
  Filled 2018-01-13 (×4): qty 1

## 2018-01-13 MED ORDER — PANTOPRAZOLE SODIUM 40 MG PO TBEC
40.0000 mg | DELAYED_RELEASE_TABLET | Freq: Two times a day (BID) | ORAL | Status: DC
  Administered 2018-01-15 – 2018-01-16 (×3): 40 mg via ORAL
  Filled 2018-01-13 (×3): qty 1

## 2018-01-13 MED ORDER — CEVIMELINE HCL 30 MG PO CAPS: 30.0000 mg | ORAL_CAPSULE | Freq: Three times a day (TID) | ORAL | Status: DC

## 2018-01-13 MED ORDER — ZOLPIDEM TARTRATE 5 MG PO TABS
5.0000 mg | ORAL_TABLET | Freq: Every day | ORAL | Status: DC
  Administered 2018-01-13 – 2018-01-15 (×3): 5 mg via ORAL
  Filled 2018-01-13 (×3): qty 1

## 2018-01-13 MED ORDER — HYDROMORPHONE HCL 1 MG/ML IJ SOLN
1.0000 mg | Freq: Once | INTRAMUSCULAR | Status: AC
  Administered 2018-01-13: 1 mg via INTRAVENOUS
  Filled 2018-01-13: qty 1

## 2018-01-13 MED ORDER — SODIUM CHLORIDE 0.9 % IV SOLN
8.0000 mg/h | INTRAVENOUS | Status: DC
  Administered 2018-01-13 – 2018-01-14 (×3): 8 mg/h via INTRAVENOUS
  Filled 2018-01-13 (×3): qty 80

## 2018-01-13 MED ORDER — ACETAMINOPHEN 325 MG PO TABS
650.0000 mg | ORAL_TABLET | Freq: Four times a day (QID) | ORAL | Status: DC | PRN
  Administered 2018-01-14 – 2018-01-15 (×4): 650 mg via ORAL
  Filled 2018-01-13 (×4): qty 2

## 2018-01-13 MED ORDER — ACETAMINOPHEN 650 MG RE SUPP: 650.0000 mg | Freq: Four times a day (QID) | RECTAL | Status: DC | PRN

## 2018-01-13 NOTE — H&P (Signed)
[facility] NAME: Beth [name]    MR#:  [id number]  DATE OF BIRTH:  05/28/1961  DATE OF ADMISSION:  01/13/2018  PRIMARY CARE PHYSICIAN: System, Pcp Not In   REQUESTING/REFERRING PHYSICIAN:   CHIEF COMPLAINT:   Chief Complaint  Patient presents with  . Abdominal Pain    HISTORY OF PRESENT ILLNESS: Beth [name]  is a 57 y.o. female with a known history per below which also includes fibromyalgia, chronic abdominal pain secondary to IBS, morbid obesity, dysphasia, obstructive sleep apnea, GERD, hiatal hernia, presents with acute lower bright red blood per rectum, large amount filling the toilet, thought she had diarrhea, started at 11:30 AM this morning, associated with abdominal discomfort, evaluation in the emergency room noted for white count 10.8, CT abdomen noted for fatty liver, repaired ventral hernia with mesh, patient evaluated in the emergency room, husband at the bedside, no apparent distress, patient is now being admitted for acute bright red blood per rectum/lower GI bleeding.  PAST MEDICAL HISTORY:   Past Medical History:  Diagnosis Date  . Asthma   . Diabetes mellitus without complication ([place])     PAST SURGICAL HISTORY:  Ventral hernia repair with mesh  SOCIAL HISTORY:  Social History   Tobacco Use  . Smoking status: Never Smoker  . Smokeless tobacco: Never Used  Substance Use Topics  . Alcohol use: Not on file    FAMILY HISTORY:  HTN  DRUG ALLERGIES:  Allergies  Allergen Reactions  . Carisoprodol Shortness Of Breath  . Other Anaphylaxis and Hives    Sutures: Prolene (blood blisters), silk and catgut (anaphylaxis), Monocril (causes infection and hives).  . Penicillins Anaphylaxis    Has patient had a PCN reaction causing immediate rash, facial/tongue/throat swelling, SOB or lightheadedness with hypotension: Yes Has patient had a PCN reaction causing severe rash involving mucus membranes or skin  necrosis: Unknown Has patient had a PCN reaction that required hospitalization: Yes Has patient had a PCN reaction occurring within the last 10 years: No If all of the above answers are "NO", then may proceed with Cephalosporin use.   . Shellfish Allergy Anaphylaxis  . Sulfamethoxazole Shortness Of Breath  . Tramadol Hcl Shortness Of Breath    Was hospitalized.  . Adhesive [Tape] Other (See Comments)    Adhesives, including surgical glue (Tears skin).  . Codeine Phosphate Itching  . Hydrocodone-Homatropine     Unknown reaction  . Peanut-Containing Drug Products Hives    Can eat peanut containing products, but only has reaction when eating peanuts alone.  Marland Kitchen Zithromax [Azithromycin] Hives    Not allergic to actual medication, but the pink dye in tablet causes hives.    REVIEW OF SYSTEMS:   CONSTITUTIONAL: No fever, fatigue or weakness.  EYES: No blurred or double vision.  EARS, NOSE, AND THROAT: No tinnitus or ear pain.  RESPIRATORY: No cough, shortness of breath, wheezing or hemoptysis.  CARDIOVASCULAR: No chest pain, orthopnea, edema.  GASTROINTESTINAL: Chronic intermittent abdominal pain due to IBS-chronic intermittent diarrhea/constipation, acute lower GI bleeding   GENITOURINARY: No dysuria, hematuria.  ENDOCRINE: No polyuria, nocturia,  HEMATOLOGY: No anemia, easy bruising or bleeding SKIN: No rash or lesion. MUSCULOSKELETAL: No joint pain or arthritis.   NEUROLOGIC: No tingling, numbness, weakness.  PSYCHIATRY: No anxiety or depression.   MEDICATIONS AT HOME:  Prior to Admission medications   Medication Sig Start Date End Date Taking? Authorizing Provider  albuterol (PROVENTIL HFA;VENTOLIN HFA) 108 (90 Base) MCG/ACT inhaler  Inhale 2 puffs into the lungs every 4 (four) hours as needed for wheezing or shortness of breath.   Yes [provider]  amLODipine (NORVASC) 5 MG tablet Take 5 mg by mouth daily. 11/20/17  Yes [provider]  cetirizine (ZYRTEC) 10  MG tablet Take 10 mg by mouth daily.   Yes [provider]  cevimeline (EVOXAC) 30 MG capsule Take 30 mg by mouth 3 (three) times daily.   Yes [provider]  cyclobenzaprine (FLEXERIL) 10 MG tablet Take 10 mg by mouth 3 (three) times daily as needed for muscle spasms.   Yes [provider]  dicyclomine (BENTYL) 10 MG capsule Take 20 mg by mouth 2 (two) times daily.   Yes [provider]  fluticasone-salmeterol (ADVAIR HFA) 115-21 MCG/ACT inhaler Inhale 2 puffs into the lungs 2 (two) times daily.   Yes [provider]  hydroxychloroquine (PLAQUENIL) 200 MG tablet Take 200 mg by mouth daily.   Yes [provider]  Milnacipran (SAVELLA) 50 MG TABS tablet Take 50 mg by mouth 2 (two) times daily.   Yes [provider]  montelukast (SINGULAIR) 10 MG tablet Take 10 mg by mouth at bedtime.   Yes [provider]  oxybutynin (DITROPAN XL) 15 MG 24 hr tablet Take 15 mg by mouth daily. 11/20/17  Yes [provider]  pantoprazole (PROTONIX) 40 MG tablet Take 40 mg by mouth 2 (two) times daily.   Yes [provider]  pravastatin (PRAVACHOL) 40 MG tablet Take 40 mg by mouth at bedtime.   Yes [provider]  Probiotic Product (PROBIOTIC DAILY PO) Take 1 capsule by mouth daily.   Yes [provider]  ranitidine (ZANTAC) 300 MG tablet Take 300 mg by mouth at bedtime.   Yes [provider]  trazodone (DESYREL) 300 MG tablet Take 300 mg by mouth at bedtime.   Yes [provider]  TURMERIC PO Take 1,000 mg by mouth 2 (two) times daily.   Yes [provider]  zolpidem (AMBIEN) 10 MG tablet Take 10 mg by mouth at bedtime. 01/07/18  Yes [provider]      PHYSICAL EXAMINATION:   VITAL SIGNS: Blood pressure 120/69, pulse 92, temperature 98.7 F (37.1 C), temperature source Oral, resp. rate 18, height 5\' 4"  (1.626 m), weight (!) 149.7 kg (330 lb), SpO2 90 %.  GENERAL:  57  y.o.-year-old patient lying in the bed with no acute distress.  Extreme morbid obesity EYES: Pupils equal, round, reactive to light and accommodation. No scleral icterus. Extraocular muscles intact.  HEENT: Head atraumatic, normocephalic. Oropharynx and nasopharynx clear.  NECK:  Supple, no jugular venous distention. No thyroid enlargement, no tenderness.  LUNGS: Normal breath sounds bilaterally, no wheezing, rales,rhonchi or crepitation. No use of accessory muscles of respiration.  CARDIOVASCULAR: S1, S2 normal. No murmurs, rubs, or gallops.  ABDOMEN: Soft, nontender, nondistended. Bowel sounds present. No organomegaly or mass.  EXTREMITIES: No pedal edema, cyanosis, or clubbing.  NEUROLOGIC: Cranial nerves II through XII are intact. MAES Gait not checked.  PSYCHIATRIC: The patient is alert and oriented x 3.  SKIN: No obvious rash, lesion, or ulcer.   LABORATORY PANEL:   CBC Recent Labs  Lab 01/13/18 1005  WBC 10.8  HGB 14.4  HCT 42.9  PLT 269  MCV 89.0  MCH 29.8  MCHC 33.5  RDW 13.9   ------------------------------------------------------------------------------------------------------------------  Chemistries  Recent Labs  Lab 01/13/18 1005  NA 136  K 3.8  CL 103  CO2 26  GLUCOSE 140*  BUN 12  CREATININE 0.63  CALCIUM 9.1  AST 33  ALT 25  ALKPHOS 93  BILITOT 0.8   ------------------------------------------------------------------------------------------------------------------ estimated creatinine clearance is 114.9 mL/min (by C-G formula based on SCr of 0.63 mg/dL). ------------------------------------------------------------------------------------------------------------------ No results for input(s): TSH, T4TOTAL, T3FREE, THYROIDAB in the last 72 hours.  Invalid input(s): FREET3   Coagulation profile No results for input(s): INR, PROTIME in the last 168  hours. ------------------------------------------------------------------------------------------------------------------- No results for input(s): DDIMER in the last 72 hours. -------------------------------------------------------------------------------------------------------------------  Cardiac Enzymes No results for input(s): CKMB, TROPONINI, MYOGLOBIN in the last 168 hours.  Invalid input(s): CK ------------------------------------------------------------------------------------------------------------------ Invalid input(s): POCBNP  ---------------------------------------------------------------------------------------------------------------  Urinalysis No results found for: COLORURINE, APPEARANCEUR, LABSPEC, PHURINE, GLUCOSEU, HGBUR, BILIRUBINUR, KETONESUR, PROTEINUR, UROBILINOGEN, NITRITE, LEUKOCYTESUR   RADIOLOGY: Ct Abdomen Pelvis W Contrast  Result Date: 01/13/2018 CLINICAL DATA:  57 year old female with bloody stools and abdominal pain with nausea since last night. Prior cholecystectomy, hysterectomy and hernia repair. Initial encounter. EXAM: CT ABDOMEN AND PELVIS WITH CONTRAST TECHNIQUE: Multidetector CT imaging of the abdomen and pelvis was performed using the standard protocol following bolus administration of intravenous contrast. CONTRAST:  124mL ISOVUE-300 IOPAMIDOL (ISOVUE-300) INJECTION 61% COMPARISON:  None. FINDINGS: Lower chest: Minimal scarring lung bases. Heart size within normal limits. Hepatobiliary: Enlarged fatty liver spanning over 18.3 cm. Tiny right sided calcifications. Post cholecystectomy. No calcified common bile duct stone. Pancreas: No pancreatic mass or inflammation. Spleen: Multiple calcifications throughout top-normal size spleen consistent with prior granulomatous exposure. Adrenals/Urinary Tract: Extra renal pelvis on the right without calyceal dilation. No obstructing stone. No worrisome renal or adrenal mass. Radiopaque material lower aspect of  the bladder/urethra. Patient has a history of urinary incontinence. Question if this is related to prior procedure. Stomach/Bowel: Under distended descending colon and partially fluid-filled sigmoid colon without extraluminal bowel inflammatory process to suggest the presence of diverticulitis. No inflammation surrounds the appendix. No primary gastric or small bowel abnormality noted. Vascular/Lymphatic: Trace calcification abdominal aorta without aneurysm. No large vessel occlusion. Top-normal size peripancreatic and intra-aortocaval lymph nodes. Reproductive: Post hysterectomy.  No worrisome adnexal mass. Other: Prior periumbilical hernia repair. Mesh extends into the base of fatty containing periumbilical hernia. No bowel containing hernia or free intraperitoneal air. Musculoskeletal: Prior fusion lumbosacral junction with degenerative changes at level just above fusion. IMPRESSION: Under distended descending colon and partially fluid-filled sigmoid colon without extraluminal bowel inflammatory process to suggest the presence of diverticulitis. Prior periumbilical hernia repair. Mesh extends into the base of fatty containing periumbilical hernia. No bowel containing hernia. Enlarged fatty liver spanning over 18.3 cm. Evidence of prior granulomatous exposure as indicated by multiple splenic calcifications and few scattered liver calcifications. Radiopaque material lower aspect of the bladder/urethra. Patient has a history of urinary incontinence. Question if this is related to prior procedure. Prior fusion lumbosacral junction. Degenerative changes at the level just above the fusion. Prior hysterectomy. Top-normal size peripancreatic and intra-aortocaval lymph nodes. Minimal Aortic Atherosclerosis (ICD10-I70.0). Electronically Signed   By: Genia Del M.D.   On: 01/13/2018 11:59    EKG: No orders found for this or any previous visit.  IMPRESSION AND PLAN: 1 acute BRBPR/lower GI bleeding Suspect  secondary to diverticular bleed Admit to regular nursing for bed, H&H every 8 hours, type and screen, transfuse as needed, strict I&O monitoring, Protonix drip, gastroenterology to see, n.p.o. except for sips with meds, and continue close medical monitoring  2 chronic diabetes mellitus type 2 Sliding scale insulin with Accu-Cheks per routine  3  chronic GERD without esophagitis Stable PPI daily  4 chronic benign essential hypertension Stable Continue home regiment  5 extreme chronic morbid obesity  most likely secondary to excess calories  6 chronic IBS with chronic intermittent abdominal pain/diarrhea/constipation Continue home regiment with Bentyl, probiotics  Lifestyle modification recommended All the records are reviewed and case discussed with ED provider. Management plans discussed with the patient, family and they are in agreement.  CODE STATUS:    TOTAL TIME TAKING CARE OF THIS PATIENT: 45 minutes.    Avel Peace Salary M.D on 01/13/2018   Between 7am to 6pm - Pager - [phone]  After 6pm go to [email or web address] - password [email or web address] [facility]  Office  [phone]  CC: Primary care physician; System, Pcp Not In   Note: This dictation was prepared with Dragon dictation along with smaller phrase technology. Any transcriptional errors that result from this process are unintentional.

## 2018-01-13 NOTE — ED Provider Notes (Addendum)
[facility] Emergency Department Provider Note  Time seen: 9:50 AM  I have reviewed the triage vital signs and the nursing notes.   HISTORY  Chief Complaint Abdominal Pain    HPI Beth [name] is a 57 y.o. female with a past medical history of fibromyalgia, diabetes, obesity, hypertension, colonic polyps, presents to the emergency department for abdominal pain and GI bleeding.  According to the patient since last night around 11 PM she developed moderate to severe left lower quadrant and lower abdominal pain.  States is a dull aching pain.  Had multiple bowel movements last night, greater than 10.  States this morning she had a bowel movement and noticed that there was bright red blood.  No history of GI bleeding in the past.  Denies any history of colitis or diverticulitis.  Denies IBD but states she does have IBS.  Currently describes her pain as a aching 9/10 pain in the left lower quadrant.  Negative for nausea or vomiting.  Negative for dysuria.  On review of systems questioning she states she does have frequent urinary tract infections but was last checked about 1 week ago and did not have one.   History reviewed. No pertinent past medical history.  Patient Active Problem List   Diagnosis Date Noted  . REACTIVE AIRWAY DISEASE 08/10/2009  . DIABETES MELLITUS, TYPE II 10/07/2007  . OBESITY, MORBID 10/07/2007  . DEPRESSION 10/07/2007  . OTHER CHRONIC PAIN 10/07/2007  . MIGRAINE HEADACHE 10/07/2007  . VENTRAL HERNIA 10/07/2007  . IRRITABLE BOWEL SYNDROME 10/07/2007  . FATTY LIVER DISEASE 10/07/2007  . FIBROMYALGIA 10/07/2007  . HOARSENESS 10/07/2007  . PALPITATIONS 10/07/2007  . CHEST PAIN, ATYPICAL 10/07/2007  . DYSPHAGIA, PHARYNGOESOPHAGEAL PHASE 10/07/2007  . URINARY INCONTINENCE 10/07/2007  . 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 HYPERCHOLESTEROLEMIA 07/21/2007  . OBSTRUCTIVE SLEEP APNEA 07/21/2007  . HYPERTENSION 07/21/2007  . SINUSITIS,  ACUTE 07/21/2007  . GERD 07/21/2007  . TREMOR 07/21/2007  . EDEMA 07/21/2007  . DYSPNEA 07/21/2007  . COUGH 07/21/2007  . DYSURIA 07/21/2007  . GASTRITIS 04/22/2006  . HIATAL HERNIA 04/22/2006  . COLONIC POLYPS, HYPERPLASTIC 09/16/2002  . EXTERNAL HEMORRHOIDS 09/16/2002    History reviewed. No pertinent surgical history.  Prior to Admission medications   Not on File    Allergies  Allergen Reactions  . Carisoprodol     REACTION: unspecified  . Codeine Phosphate     REACTION: unspecified  . Hydrocodone-Homatropine     REACTION: unspecified  . Penicillins   . Sulfamethoxazole     REACTION: unspecified  . Tramadol Hcl     REACTION: unspecified    No family history on file.  Social History Social History   Tobacco Use  . Smoking status: Never Smoker  . Smokeless tobacco: Never Used  Substance Use Topics  . Alcohol use: Not on file  . Drug use: Not on file    Review of Systems Constitutional: Negative for fever. Eyes: Negative for visual complaints ENT: Negative for recent illness/congestion Cardiovascular: Negative for chest pain. Respiratory: Negative for shortness of breath. Gastrointestinal: Moderate to severe left lower quadrant abdominal pain.  Positive for loose bright red stool.  Negative for vomiting. Genitourinary: Negative for urinary compaints Musculoskeletal: Negative for musculoskeletal complaints Skin: Negative for skin complaints  Neurological: Negative for headache All other ROS negative  ____________________________________________   PHYSICAL EXAM:  VITAL SIGNS: ED Triage Vitals  Enc Vitals Group     BP 01/13/18 0917 139/66     Pulse  Rate 01/13/18 0917 (!) 101     Resp 01/13/18 0917 18     Temp 01/13/18 0917 98.7 F (37.1 C)     Temp Source 01/13/18 0917 Oral     SpO2 01/13/18 0917 94 %     Weight 01/13/18 0918 (!) 330 lb (149.7 kg)     Height 01/13/18 0918 5\' 4"  (1.626 m)     Head Circumference --      Peak Flow --      Pain  Score 01/13/18 0917 8     Pain Loc --      Pain Edu? --      Excl. in [place]? --     Constitutional: Alert and oriented. Well appearing and in no distress. Eyes: Normal exam ENT   Head: Normocephalic and atraumatic.   Mouth/Throat: Mucous membranes are moist. Cardiovascular: Normal rate, regular rhythm. No murmur Respiratory: Normal respiratory effort without tachypnea nor retractions. Breath sounds are clear Gastrointestinal: Moderate left lower quadrant suprapubic tenderness to palpation.  No rebound or guarding.  No distention. Musculoskeletal: Nontender with normal range of motion in all extremities.  Neurologic:  Normal speech and language. No gross focal neurologic deficits Skin:  Skin is warm, dry and intact.  Psychiatric: Mood and affect are normal.   ____________________________________________     RADIOLOGY  Ultrasound shows partially fluid-filled sigmoid colon but no other inflammatory process to suggest active diverticulitis.  ____________________________________________   INITIAL IMPRESSION / ASSESSMENT AND PLAN / ED COURSE  Pertinent labs & imaging results that were available during my care of the patient were reviewed by me and considered in my medical decision making (see chart for details).  Presents emergency department for left lower quadrant pain and bright red blood per rectum.  Differential would include diverticulosis, diverticulitis, colitis, colon cancer.  We will check labs including type and screen, given the patient's moderate left lower quadrant tenderness we will obtain a CT scan to further evaluate.  We will treat pain, dose nausea medication IV hydrate while awaiting results.  Patient agreeable to this plan of care. Rectal examination shows no obvious hemorrhoids, gross blood per rectum strongly guaiac positive.  The largely negative.  We will admit to the hospitalist service for GI bleed patient agreeable to this plan of care.  H&H stable.   Given the patient's moderate left lower quadrant tenderness and equivocal CT we will cover with oral Augmentin as a precaution for possible diverticular disease.  ____________________________________________   FINAL CLINICAL IMPRESSION(S) / ED DIAGNOSES  Left lower quadrant pain GI bleed    Harvest Dark, MD 01/13/18 1248    Harvest Dark, MD 01/13/18 1255

## 2018-01-13 NOTE — ED Triage Notes (Signed)
Patient presents to the ED with abdominal pain that began at 11:30 last night with diarrhea.  Patient states, "I've always had colon issues, and I thought this had to do with drinking milk too late, but as it went on, I realized my stool was just bright red blood."  Patient is in no obvious distress at this time.  Denies vomiting.

## 2018-01-13 NOTE — ED Notes (Signed)
MD Paduchowski at bedside to update patient on plan of care

## 2018-01-13 NOTE — Plan of Care (Signed)
Dr. Text to consider other PRN pain options per patient request. Currently 8 of 10 pain.

## 2018-01-13 NOTE — ED Notes (Signed)
Pt back from CT

## 2018-01-13 NOTE — ED Notes (Signed)
Attempted to call report. RN giving report to EMS and will call back

## 2018-01-13 NOTE — ED Notes (Signed)
Patient transported to CT 

## 2018-01-14 DIAGNOSIS — R1032 Left lower quadrant pain: Secondary | ICD-10-CM

## 2018-01-14 DIAGNOSIS — K625 Hemorrhage of anus and rectum: Secondary | ICD-10-CM

## 2018-01-14 LAB — GLUCOSE, CAPILLARY
Glucose-Capillary: 94 mg/dL (ref 65–99)
Glucose-Capillary: 90 mg/dL (ref 65–99)
Glucose-Capillary: 108 mg/dL — ABNORMAL HIGH (ref 65–99)
Glucose-Capillary: 113 mg/dL — ABNORMAL HIGH (ref 65–99)

## 2018-01-14 LAB — HEMOGLOBIN AND HEMATOCRIT, BLOOD
HCT: 37.8 % (ref 35.0–47.0)
HCT: 38.5 % (ref 35.0–47.0)
HEMATOCRIT: 37.7 % (ref 35.0–47.0)
HEMOGLOBIN: 12.6 g/dL (ref 12.0–16.0)
HEMOGLOBIN: 12.9 g/dL (ref 12.0–16.0)
Hemoglobin: 12.4 g/dL (ref 12.0–16.0)

## 2018-01-14 LAB — HIV ANTIBODY (ROUTINE TESTING W REFLEX): HIV Screen 4th Generation wRfx: NONREACTIVE

## 2018-01-14 NOTE — Consult Note (Signed)
Cephas Darby, MD [address]  [place]  [place], [place] [address]  Main: [phone]  Fax: [phone] Pager: [phone]   Consultation  Referring Provider:     No ref. provider found Primary Care Physician:  System, [place] Not In Primary Gastroenterologist: [facility]         Reason for Consultation:     LLQ pain, hematochezia  Date of Admission:  01/13/2018 Date of Consultation:  01/14/2018         HPI:   Beth [name] is a 57 y.o. female with morbid obesity, chronic OA knees, IBS-constipation admitted with 1 day h/o LLQ pain a/w several episodes of small amounts of BRBPR. She is from [facility], came to visit her son in [place]. Her husband is bedside. She developed LLQ pain on 01/12/18 night, a/w several episodes of small volume rectal bleeding. She had CT in ER which revealed diverticulosis. Recived cipro and flagyl in ER, admitted for further management, GI consulted due to rectal bleeding. She continues to have LLQ pain and scant amounts of rectal bleeding. Hb is overall stable  NSAIDs: Ibuprofen 844m daily for arthritis pain chronically  Antiplts/Anticoagulants/Anti thrombotics: none  GI Procedures: Colonoscopy 09/2002 for diarrhea, normal Random colon biopsies normal including normal TI  She has strong family h/o colon cancer  Past Medical History:  Diagnosis Date  . Asthma   . [place]     History reviewed. No pertinent surgical history.  Prior to Admission medications   Medication Sig Start Date End Date Taking? Authorizing Provider  albuterol (PROVENTIL HFA;VENTOLIN HFA) 108 (90 Base) MCG/ACT inhaler Inhale 2 puffs into the lungs every 4 (four) hours as needed for wheezing or shortness of breath.   Yes [provider]  amLODipine (NORVASC) 5 MG tablet Take 5 mg by mouth daily. 11/20/17  Yes [provider]  cetirizine (ZYRTEC) 10 MG tablet Take 10 mg by mouth daily.   Yes [provider]  cevimeline (EVOXAC) 30 MG capsule Take 30 mg by mouth 3 (three) times daily.   Yes [provider]  cyclobenzaprine (FLEXERIL) 10 MG tablet Take 10 mg by mouth 3 (three) times daily as needed for muscle spasms.   Yes [provider]  dicyclomine (BENTYL) 10 MG capsule Take 20 mg by mouth 2 (two) times daily.   Yes [provider]  fluticasone-salmeterol (ADVAIR HFA) 115-21 MCG/ACT inhaler Inhale 2 puffs into the lungs 2 (two) times daily.   Yes [provider]  hydroxychloroquine (PLAQUENIL) 200 MG tablet Take 200 mg by mouth daily.   Yes [provider]  Milnacipran (SAVELLA) 50 MG TABS tablet Take 50 mg by mouth 2 (two) times daily.   Yes [provider]  montelukast (SINGULAIR) 10 MG tablet Take 10 mg by mouth at bedtime.   Yes [provider]  oxybutynin (DITROPAN XL) 15 MG 24 hr tablet Take 15 mg by mouth daily. 11/20/17  Yes [provider]  pantoprazole (PROTONIX) 40 MG tablet Take 40 mg by mouth 2 (two) times daily.   Yes [provider]  pravastatin (PRAVACHOL) 40 MG tablet Take 40 mg by mouth at bedtime.   Yes [provider]  Probiotic Product (PROBIOTIC DAILY PO) Take 1 capsule by mouth daily.   Yes [provider]  ranitidine (ZANTAC) 300 MG tablet Take 300 mg by mouth at bedtime.   Yes [provider]  trazodone (DESYREL) 300 MG tablet Take 300 mg  by mouth at bedtime.   Yes [provider]  TURMERIC PO Take 1,000 mg by mouth 2 (two) times daily.   Yes [provider]  zolpidem (AMBIEN) 10 MG tablet Take 10 mg by mouth at bedtime. 01/07/18  Yes [provider]    History reviewed. No pertinent family history.   Social History   Tobacco Use  . Smoking status: Never Smoker  . Smokeless tobacco: Never Used  Substance Use Topics  . Alcohol use: Not on file  . Drug use: Not on file    Allergies as of 01/13/2018 - Review Complete  01/13/2018  Allergen Reaction Noted  . Carisoprodol Shortness Of Breath 02/11/2006  . Other Anaphylaxis and Hives 01/13/2018  . Penicillins Anaphylaxis   . Shellfish allergy Anaphylaxis 01/13/2018  . Sulfamethoxazole Shortness Of Breath 02/11/2006  . Tramadol hcl Shortness Of Breath 02/11/2006  . Adhesive [tape] Other (See Comments) 01/13/2018  . Codeine phosphate Itching 02/11/2006  . Hydrocodone-homatropine  02/11/2006  . Peanut-containing drug products Hives 01/13/2018  . Zithromax [azithromycin] Hives 01/13/2018    Review of Systems:    All systems reviewed and negative except where noted in HPI.   Physical Exam:  Vital signs in last 24 hours: Temp:  [98.2 F (36.8 C)-98.9 F (37.2 C)] 98.9 F (37.2 C) (04/09 2025) Pulse Rate:  [85-101] 95 (04/09 2025) Resp:  [18-20] 20 (04/09 2025) BP: (115-139)/(57-74) 123/57 (04/09 2025) SpO2:  [90 %-100 %] 99 % (04/09 2025) Weight:  [330 lb (149.7 kg)] 330 lb (149.7 kg) (04/09 0918) Last BM Date: 01/13/18 General:   Pleasant, cooperative in NAD Head:  Normocephalic and atraumatic. Eyes:   No icterus.   Conjunctiva pink. PERRLA. Ears:  Normal auditory acuity. Neck:  Supple; no masses or thyroidomegaly Lungs: Respirations even and unlabored. Lungs clear to auscultation bilaterally.   No wheezes, crackles, or rhonchi.  Heart:  Regular rate and rhythm;  Without murmur, clicks, rubs or gallops Abdomen:  Soft, nondistended, moderate tenderness in LLQ, mild tenderness in upper abdomen. Normal bowel sounds. Morbidly obese, No rebound or guarding.  Rectal:  Not performed. Msk:  Symmetrical without gross deformities.  Strength normal  Extremities:  Without edema, cyanosis or clubbing. Neurologic:  Alert and oriented x3;  grossly normal neurologically. Skin:  Intact without significant lesions or rashes. Cervical Nodes:  No significant cervical adenopathy. Psych:  Alert and cooperative. Normal affect.  LAB RESULTS: CBC Latest Ref Rng &  Units 01/13/2018 01/13/2018 01/13/2018  WBC 3.6 - 11.0 K/uL - - 10.8  Hemoglobin 12.0 - 16.0 g/dL 12.9 13.2 14.4  Hematocrit 35.0 - 47.0 % 38.5 39.1 42.9  Platelets 150 - 440 K/uL - - 269    BMET BMP Latest Ref Rng & Units 01/13/2018 09/07/2008 06/30/2008  Glucose 65 - 99 mg/dL 140(H) 103(H) 150(H)  BUN 6 - 20 mg/dL 12 5(L) 5(L)  Creatinine 0.44 - 1.00 mg/dL 0.63 0.66 0.71  Sodium 135 - 145 mmol/L 136 139 135  Potassium 3.5 - 5.1 mmol/L 3.8 3.9 3.5  Chloride 101 - 111 mmol/L 103 103 102  CO2 22 - 32 mmol/L 26 32 26  Calcium 8.9 - 10.3 mg/dL 9.1 8.9 8.9    LFT Hepatic Function Latest Ref Rng & Units 01/13/2018 09/07/2008 12/07/2007  Total Protein 6.5 - 8.1 g/dL 8.3(H) 6.8 6.8  Albumin 3.5 - 5.0 g/dL 3.8 3.1(L) 3.2(L)  AST 15 - 41 U/L 33 36 23  ALT 14 - 54 U/L 25 36(H) 21  Alk Phosphatase 38 -  126 U/L 93 92 91  Total Bilirubin 0.3 - 1.2 mg/dL 0.8 0.3 0.4  Bilirubin, Direct 0.0 - 0.3 mg/dL - - -     STUDIES: Ct Abdomen Pelvis W Contrast  Result Date: 01/13/2018 CLINICAL DATA:  57 year old female with bloody stools and abdominal pain with nausea since last night. Prior cholecystectomy, hysterectomy and hernia repair. Initial encounter. EXAM: CT ABDOMEN AND PELVIS WITH CONTRAST TECHNIQUE: Multidetector CT imaging of the abdomen and pelvis was performed using the standard protocol following bolus administration of intravenous contrast. CONTRAST:  13m ISOVUE-300 IOPAMIDOL (ISOVUE-300) INJECTION 61% COMPARISON:  None. FINDINGS: Lower chest: Minimal scarring lung bases. Heart size within normal limits. Hepatobiliary: Enlarged fatty liver spanning over 18.3 cm. Tiny right sided calcifications. Post cholecystectomy. No calcified common bile duct stone. Pancreas: No pancreatic mass or inflammation. Spleen: Multiple calcifications throughout top-normal size spleen consistent with prior granulomatous exposure. Adrenals/Urinary Tract: Extra renal pelvis on the right without calyceal dilation. No obstructing  stone. No worrisome renal or adrenal mass. Radiopaque material lower aspect of the bladder/urethra. Patient has a history of urinary incontinence. Question if this is related to prior procedure. Stomach/Bowel: Under distended descending colon and partially fluid-filled sigmoid colon without extraluminal bowel inflammatory process to suggest the presence of diverticulitis. No inflammation surrounds the appendix. No primary gastric or small bowel abnormality noted. Vascular/Lymphatic: Trace calcification abdominal aorta without aneurysm. No large vessel occlusion. Top-normal size peripancreatic and intra-aortocaval lymph nodes. Reproductive: Post hysterectomy.  No worrisome adnexal mass. Other: Prior periumbilical hernia repair. Mesh extends into the base of fatty containing periumbilical hernia. No bowel containing hernia or free intraperitoneal air. Musculoskeletal: Prior fusion lumbosacral junction with degenerative changes at level just above fusion. IMPRESSION: Under distended descending colon and partially fluid-filled sigmoid colon without extraluminal bowel inflammatory process to suggest the presence of diverticulitis. Prior periumbilical hernia repair. Mesh extends into the base of fatty containing periumbilical hernia. No bowel containing hernia. Enlarged fatty liver spanning over 18.3 cm. Evidence of prior granulomatous exposure as indicated by multiple splenic calcifications and few scattered liver calcifications. Radiopaque material lower aspect of the bladder/urethra. Patient has a history of urinary incontinence. Question if this is related to prior procedure. Prior fusion lumbosacral junction. Degenerative changes at the level just above the fusion. Prior hysterectomy. Top-normal size peripancreatic and intra-aortocaval lymph nodes. Minimal Aortic Atherosclerosis (ICD10-I70.0). Electronically Signed   By: SGenia DelM.D.   On: 01/13/2018 11:59      Impression / Plan:   DChanise [name] is a 57y.o. female with morbid obesity, IBS-C p/w 1 day h/o LLQ pain and hematochezia. Infectious colitis or diverticulitis or IBD or NSAID induced colitis  - Stool studies to r/o infection - Start cipro and flagyl for possible acute diverticulitis - Will consider colonoscopy if symptoms not improving - Avoid NSAIDs - CLD   Thank you for involving me in the care of this patient. Will follow along with you    LOS: 1 day   RSherri Sear MD  01/14/2018, 12:25 AM   Note: This dictation was prepared with Dragon dictation along with smaller phrase technology. Any transcriptional errors that result from this process are unintentional.

## 2018-01-14 NOTE — Progress Notes (Signed)
[place] [facility] [place] NAME: Beth [name]    MR#:  [id number]  DATE OF BIRTH:  August 11, 1961  SUBJECTIVE: 57 year old female patient with history of morbid obesity, osteoarthritis, constipation came in because of several episodes of small amount of bright red blood from rectum yesterday.  Admitted to hospital for possible diverticular bleed, started on IV Cipro, Flagyl, IV fluids, seen by gastroenterology.  No further ectopy since admission, does have some abdominal cramping.  Patient had normal BM without any blood this morning.  Hemoglobin stable around 12.4.  CHIEF COMPLAINT:   Chief Complaint  Patient presents with  . Abdominal Pain    REVIEW OF SYSTEMS:   ROS CONSTITUTIONAL: No fever, fatigue or weakness.  EYES: No blurred or double vision.  EARS, NOSE, AND THROAT: No tinnitus or ear pain.  RESPIRATORY: No cough, shortness of breath, wheezing or hemoptysis.  CARDIOVASCULAR: No chest pain, orthopnea, edema.  GASTROINTESTINAL: no Nausea or vomiting.  No diarrhea.  Patient had normal BM without blood.   GENITOURINARY: No dysuria, hematuria.  ENDOCRINE: No polyuria, nocturia,  HEMATOLOGY: No anemia, easy bruising or bleeding SKIN: No rash or lesion. MUSCULOSKELETAL: No joint pain or arthritis.   NEUROLOGIC: No tingling, numbness, weakness.  PSYCHIATRY: No anxiety or depression.   DRUG ALLERGIES:   Allergies  Allergen Reactions  . Carisoprodol Shortness Of Breath  . Other Anaphylaxis and Hives    Sutures: Prolene (blood blisters), silk and catgut (anaphylaxis), Monocril (causes infection and hives).  . Penicillins Anaphylaxis    Has patient had a PCN reaction causing immediate rash, facial/tongue/throat swelling, SOB or lightheadedness with hypotension: Yes Has patient had a PCN reaction causing severe rash involving mucus membranes or skin necrosis: Unknown Has patient had a PCN reaction that required hospitalization:  Yes Has patient had a PCN reaction occurring within the last 10 years: No If all of the above answers are "NO", then may proceed with Cephalosporin use.   . Shellfish Allergy Anaphylaxis  . Sulfamethoxazole Shortness Of Breath  . Tramadol Hcl Shortness Of Breath    Was hospitalized.  . Adhesive [Tape] Other (See Comments)    Adhesives, including surgical glue (Tears skin).  . Codeine Phosphate Itching  . Hydrocodone-Homatropine     Unknown reaction  . Peanut-Containing Drug Products Hives    Can eat peanut containing products, but only has reaction when eating peanuts alone.  Marland Kitchen Zithromax [Azithromycin] Hives    Not allergic to actual medication, but the pink dye in tablet causes hives.    VITALS:  Blood pressure 110/65, pulse 85, temperature 98.2 F (36.8 C), temperature source Oral, resp. rate 16, height 5\' 4"  (1.626 m), weight (!) 149.7 kg (330 lb), SpO2 99 %.  PHYSICAL EXAMINATION:  GENERAL:  57 y.o.-year-old patient lying in the bed with no acute distress.  EYES: Pupils equal, round, reactive to light and accommodation. No scleral icterus. Extraocular muscles intact.  HEENT: Head atraumatic, normocephalic. Oropharynx and nasopharynx clear.  NECK:  Supple, no jugular venous distention. No thyroid enlargement, no tenderness.  LUNGS: Normal breath sounds bilaterally, no wheezing, rales,rhonchi or crepitation. No use of accessory muscles of respiration.  CARDIOVASCULAR: S1, S2 normal. No murmurs, rubs, or gallops.  ABDOMEN: Soft, nontender, nondistended. Bowel sounds present. No organomegaly or mass.  EXTREMITIES: No pedal edema, cyanosis, or clubbing.  NEUROLOGIC: Cranial nerves II through XII are intact. Muscle strength 5/5 in all extremities. Sensation intact. Gait not checked.  PSYCHIATRIC: The patient is alert and  oriented x 3.  SKIN: No obvious rash, lesion, or ulcer.    LABORATORY PANEL:   CBC Recent Labs  Lab 01/13/18 1005  01/14/18 0728  WBC 10.8  --   --   HGB  14.4   < > 12.4  HCT 42.9   < > 37.7  PLT 269  --   --    < > = values in this interval not displayed.   ------------------------------------------------------------------------------------------------------------------  Chemistries  Recent Labs  Lab 01/13/18 1005  NA 136  K 3.8  CL 103  CO2 26  GLUCOSE 140*  BUN 12  CREATININE 0.63  CALCIUM 9.1  AST 33  ALT 25  ALKPHOS 93  BILITOT 0.8   ------------------------------------------------------------------------------------------------------------------  Cardiac Enzymes No results for input(s): TROPONINI in the last 168 hours. ------------------------------------------------------------------------------------------------------------------  RADIOLOGY:  Ct Abdomen Pelvis W Contrast  Result Date: 01/13/2018 CLINICAL DATA:  57 year old female with bloody stools and abdominal pain with nausea since last night. Prior cholecystectomy, hysterectomy and hernia repair. Initial encounter. EXAM: CT ABDOMEN AND PELVIS WITH CONTRAST TECHNIQUE: Multidetector CT imaging of the abdomen and pelvis was performed using the standard protocol following bolus administration of intravenous contrast. CONTRAST:  126mL ISOVUE-300 IOPAMIDOL (ISOVUE-300) INJECTION 61% COMPARISON:  None. FINDINGS: Lower chest: Minimal scarring lung bases. Heart size within normal limits. Hepatobiliary: Enlarged fatty liver spanning over 18.3 cm. Tiny right sided calcifications. Post cholecystectomy. No calcified common bile duct stone. Pancreas: No pancreatic mass or inflammation. Spleen: Multiple calcifications throughout top-normal size spleen consistent with prior granulomatous exposure. Adrenals/Urinary Tract: Extra renal pelvis on the right without calyceal dilation. No obstructing stone. No worrisome renal or adrenal mass. Radiopaque material lower aspect of the bladder/urethra. Patient has a history of urinary incontinence. Question if this is related to prior procedure.  Stomach/Bowel: Under distended descending colon and partially fluid-filled sigmoid colon without extraluminal bowel inflammatory process to suggest the presence of diverticulitis. No inflammation surrounds the appendix. No primary gastric or small bowel abnormality noted. Vascular/Lymphatic: Trace calcification abdominal aorta without aneurysm. No large vessel occlusion. Top-normal size peripancreatic and intra-aortocaval lymph nodes. Reproductive: Post hysterectomy.  No worrisome adnexal mass. Other: Prior periumbilical hernia repair. Mesh extends into the base of fatty containing periumbilical hernia. No bowel containing hernia or free intraperitoneal air. Musculoskeletal: Prior fusion lumbosacral junction with degenerative changes at level just above fusion. IMPRESSION: Under distended descending colon and partially fluid-filled sigmoid colon without extraluminal bowel inflammatory process to suggest the presence of diverticulitis. Prior periumbilical hernia repair. Mesh extends into the base of fatty containing periumbilical hernia. No bowel containing hernia. Enlarged fatty liver spanning over 18.3 cm. Evidence of prior granulomatous exposure as indicated by multiple splenic calcifications and few scattered liver calcifications. Radiopaque material lower aspect of the bladder/urethra. Patient has a history of urinary incontinence. Question if this is related to prior procedure. Prior fusion lumbosacral junction. Degenerative changes at the level just above the fusion. Prior hysterectomy. Top-normal size peripancreatic and intra-aortocaval lymph nodes. Minimal Aortic Atherosclerosis (ICD10-I70.0). Electronically Signed   By: Genia Del M.D.   On: 01/13/2018 11:59    EKG:  No orders found for this or any previous visit.  ASSESSMENT AND PLAN:   57 year old female patient with history of IBS, morbid obesity comes in with left lower quadrant abdominal pain, hematochezia likely infectious colitis/NSAID  induced sclerosis, patient is feeling much better on Cipro, Flagyl, IV fluids.  Start clear liquids, hemoglobin is stable advance the diet tomorrow to regular fluid before discharging.  Patient is  from [place] and she will go there after discharge.  She is visiting her son here. 2 /depression Diabetes mellitus type 2: 4.  Overactive bladder 5.  Start clear liquids today, discontinue Protonix, hemoglobin is stableLikely discharge home tomorrow.   With Oral antibiotics     All the records are reviewed and case discussed with Care Management/Social Workerr. Management plans discussed with the patient, family and they are in agreement.  CODE STATUS:full  TOTAL TIME TAKING CARE OF THIS PATIENT: 43minutes.   POSSIBLE D/C IN 1-2 DAYS, DEPENDING ON CLINICAL CONDITION.   Epifanio Lesches M.D on 01/14/2018 at 2:50 PM  Between 7am to 6pm - Pager - [phone]  After 6pm go to [email or web address] - password [email or web address] [facility]  Office  [phone]  CC: Primary care physician; System, Pcp Not In   Note: This dictation was prepared with Dragon dictation along with smaller phrase technology. Any transcriptional errors that result from this process are unintentional.

## 2018-01-14 NOTE — Progress Notes (Signed)
Pt complaining of abdominal pain of 8/10.  Spoke with Dr Rexene Edison.  Will try giving bentyl early as pt takes bentyl at home for chronic abdominal pain from IBS. Dorna Bloom RN

## 2018-01-14 NOTE — Progress Notes (Signed)
Cephas Darby, MD [address]  [place]  [place], [place] [address]  Main: [phone]  Fax: [phone] Pager: [phone]   Subjective: Symptoms resolved. Feels better  Objective: Vital signs in last 24 hours: Vitals:   01/14/18 0432 01/14/18 0905 01/14/18 0912 01/14/18 1513  BP: 117/68 110/65 110/65 125/72  Pulse: 88  85 85  Resp: 17  16 12   Temp: 98.2 F (36.8 C)   98.8 F (37.1 C)  TempSrc: Oral   Oral  SpO2: 97%  99% 96%  Weight:      Height:       Weight change:   Intake/Output Summary (Last 24 hours) at 01/14/2018 1851 Last data filed at 01/14/2018 1500 Gross per 24 hour  Intake 2150 ml  Output -  Net 2150 ml     Exam: Heart:: Regular rate and rhythm Lungs: clear to auscultation Abdomen: soft, nontender, normal bowel sounds   Lab Results: CBC Latest Ref Rng & Units 01/14/2018 01/14/2018 01/13/2018  WBC 3.6 - 11.0 K/uL - - -  Hemoglobin 12.0 - 16.0 g/dL 12.6 12.4 12.9  Hematocrit 35.0 - 47.0 % 37.8 37.7 38.5  Platelets 150 - 440 K/uL - - -   Micro Results: Recent Results (from the past 240 hour(s))  C difficile quick scan w PCR reflex     Status: None   Collection Time: 01/13/18  7:41 PM  Result Value Ref Range Status   C Diff antigen NEGATIVE NEGATIVE Final   C Diff toxin NEGATIVE NEGATIVE Final   C Diff interpretation No C. difficile detected.  Final    Comment: Performed at [facility], [address] [place] [address]  Gastrointestinal Panel by PCR , Stool     Status: None   Collection Time: 01/13/18  7:41 PM  Result Value Ref Range Status   Campylobacter species NOT DETECTED NOT DETECTED Final   Plesimonas shigelloides NOT DETECTED NOT DETECTED Final   Salmonella species NOT DETECTED NOT DETECTED Final   Yersinia enterocolitica NOT DETECTED NOT DETECTED Final   Vibrio species NOT DETECTED NOT DETECTED Final   Vibrio cholerae NOT DETECTED NOT DETECTED Final   Enteroaggregative E coli (EAEC) NOT DETECTED NOT  DETECTED Final   Enteropathogenic E coli (EPEC) NOT DETECTED NOT DETECTED Final   Enterotoxigenic E coli (ETEC) NOT DETECTED NOT DETECTED Final   Shiga like toxin producing E coli (STEC) NOT DETECTED NOT DETECTED Final   Shigella/Enteroinvasive E coli (EIEC) NOT DETECTED NOT DETECTED Final   Cryptosporidium NOT DETECTED NOT DETECTED Final   Cyclospora cayetanensis NOT DETECTED NOT DETECTED Final   Entamoeba histolytica NOT DETECTED NOT DETECTED Final   Giardia lamblia NOT DETECTED NOT DETECTED Final   Adenovirus F40/41 NOT DETECTED NOT DETECTED Final   Astrovirus NOT DETECTED NOT DETECTED Final   Norovirus GI/GII NOT DETECTED NOT DETECTED Final   Rotavirus A NOT DETECTED NOT DETECTED Final   Sapovirus (I, II, IV, and V) NOT DETECTED NOT DETECTED Final    Comment: Performed at [facility], [address] [place], [place] [address]   Studies/Results: Ct Abdomen Pelvis W Contrast  Result Date: 01/13/2018 CLINICAL DATA:  57 year old female with bloody stools and abdominal pain with nausea since last night. Prior cholecystectomy, hysterectomy and hernia repair. Initial encounter. EXAM: CT ABDOMEN AND PELVIS WITH CONTRAST TECHNIQUE: Multidetector CT imaging of the abdomen and pelvis was performed using the standard protocol following bolus administration of intravenous contrast. CONTRAST:  135mL ISOVUE-300 IOPAMIDOL (ISOVUE-300)  INJECTION 61% COMPARISON:  None. FINDINGS: Lower chest: Minimal scarring lung bases. Heart size within normal limits. Hepatobiliary: Enlarged fatty liver spanning over 18.3 cm. Tiny right sided calcifications. Post cholecystectomy. No calcified common bile duct stone. Pancreas: No pancreatic mass or inflammation. Spleen: Multiple calcifications throughout top-normal size spleen consistent with prior granulomatous exposure. Adrenals/Urinary Tract: Extra renal pelvis on the right without calyceal dilation. No obstructing stone. No worrisome renal or adrenal mass.  Radiopaque material lower aspect of the bladder/urethra. Patient has a history of urinary incontinence. Question if this is related to prior procedure. Stomach/Bowel: Under distended descending colon and partially fluid-filled sigmoid colon without extraluminal bowel inflammatory process to suggest the presence of diverticulitis. No inflammation surrounds the appendix. No primary gastric or small bowel abnormality noted. Vascular/Lymphatic: Trace calcification abdominal aorta without aneurysm. No large vessel occlusion. Top-normal size peripancreatic and intra-aortocaval lymph nodes. Reproductive: Post hysterectomy.  No worrisome adnexal mass. Other: Prior periumbilical hernia repair. Mesh extends into the base of fatty containing periumbilical hernia. No bowel containing hernia or free intraperitoneal air. Musculoskeletal: Prior fusion lumbosacral junction with degenerative changes at level just above fusion. IMPRESSION: Under distended descending colon and partially fluid-filled sigmoid colon without extraluminal bowel inflammatory process to suggest the presence of diverticulitis. Prior periumbilical hernia repair. Mesh extends into the base of fatty containing periumbilical hernia. No bowel containing hernia. Enlarged fatty liver spanning over 18.3 cm. Evidence of prior granulomatous exposure as indicated by multiple splenic calcifications and few scattered liver calcifications. Radiopaque material lower aspect of the bladder/urethra. Patient has a history of urinary incontinence. Question if this is related to prior procedure. Prior fusion lumbosacral junction. Degenerative changes at the level just above the fusion. Prior hysterectomy. Top-normal size peripancreatic and intra-aortocaval lymph nodes. Minimal Aortic Atherosclerosis (ICD10-I70.0). Electronically Signed   By: Genia Del M.D.   On: 01/13/2018 11:59   Medications: I have reviewed the patient's current medications. Scheduled Meds: .  acidophilus   Oral Daily  . amLODipine  5 mg Oral Daily  . cevimeline  30 mg Oral TID  . ciprofloxacin  500 mg Oral BID  . dicyclomine  20 mg Oral BID  . hydroxychloroquine  200 mg Oral Daily  . insulin aspart  0-20 Units Subcutaneous TID WC  . insulin aspart  0-5 Units Subcutaneous QHS  . loratadine  10 mg Oral Daily  . mouth rinse  15 mL Mouth Rinse BID  . Milnacipran  50 mg Oral BID  . mometasone-formoterol  2 puff Inhalation BID  . montelukast  10 mg Oral QHS  . oxybutynin  15 mg Oral Daily  . [START ON 01/15/2018] pantoprazole  40 mg Oral BID  . pravastatin  40 mg Oral QHS  . trazodone  300 mg Oral QHS  . zolpidem  5 mg Oral QHS   Continuous Infusions: . metronidazole Stopped (01/14/18 1835)   PRN Meds:.acetaminophen **OR** acetaminophen, albuterol, amitriptyline, cyclobenzaprine, gabapentin, ondansetron **OR** ondansetron (ZOFRAN) IV   Assessment: Active Problems:   GIB (gastrointestinal bleeding)   Tyrica Lizmarie Witters is a 57 y.o. female with morbid obesity, IBS-C p/w 1 day h/o LLQ pain and hematochezia. Stool studies negative for infection, symptoms responded to empiric cipro+flagyl for probable acute diverticulitis  Plan: Continue cipro+flagyl for 7days total Follow up with GI locally Advance diet Discharge home tomorrow   LOS: 1 day   Araceli Coufal 01/14/2018, 6:51 PM

## 2018-01-15 DIAGNOSIS — R1032 Left lower quadrant pain: Secondary | ICD-10-CM | POA: Diagnosis not present

## 2018-01-15 LAB — GLUCOSE, CAPILLARY
GLUCOSE-CAPILLARY: 104 mg/dL — AB (ref 65–99)
GLUCOSE-CAPILLARY: 148 mg/dL — AB (ref 65–99)
GLUCOSE-CAPILLARY: 101 mg/dL — AB (ref 65–99)
Glucose-Capillary: 110 mg/dL — ABNORMAL HIGH (ref 65–99)
Glucose-Capillary: 125 mg/dL — ABNORMAL HIGH (ref 65–99)

## 2018-01-15 MED ORDER — METRONIDAZOLE 500 MG PO TABS
500.0000 mg | ORAL_TABLET | Freq: Two times a day (BID) | ORAL | Status: DC
  Administered 2018-01-15 – 2018-01-16 (×2): 500 mg via ORAL
  Filled 2018-01-15 (×2): qty 1

## 2018-01-15 MED ORDER — METRONIDAZOLE 500 MG PO TABS: 500.0000 mg | ORAL_TABLET | Freq: Three times a day (TID) | ORAL | 0 refills | Status: AC

## 2018-01-15 MED ORDER — SUMATRIPTAN SUCCINATE 50 MG PO TABS
50.0000 mg | ORAL_TABLET | ORAL | Status: DC | PRN
  Administered 2018-01-15: 50 mg via ORAL
  Filled 2018-01-15 (×2): qty 1

## 2018-01-15 MED ORDER — CIPROFLOXACIN HCL 500 MG PO TABS: 500.0000 mg | ORAL_TABLET | Freq: Two times a day (BID) | ORAL | 0 refills | Status: AC

## 2018-01-15 NOTE — Progress Notes (Signed)
[place] [facility] [place] NAME: Elyce Zollinger    MR#:  [id number]  DATE OF BIRTH:  April 06, 1961  SUBJECTIVE: Having migraine headache.  Patient usually takes Maxalt at home.  Says that the abdominal pain much better.  Tolerating liquid diet.  Wants to advance the diet today and see how she tolerates and likely go home tomorrow.  Patient is from [place] and she would rather go home tomorrow make sure she tolerates solid food without any further abdominal pain.  CHIEF COMPLAINT:   Chief Complaint  Patient presents with  . Abdominal Pain    REVIEW OF SYSTEMS:   ROS CONSTITUTIONAL: No fever, fatigue or weakness.  EYES: No blurred or double vision.  EARS, NOSE, AND THROAT: No tinnitus or ear pain.  RESPIRATORY: No cough, shortness of breath, wheezing or hemoptysis.  CARDIOVASCULAR: No chest pain, orthopnea, edema.  GASTROINTESTINAL: no Nausea or vomiting.  No diarrhea.  Patient had normal BM without blood.   GENITOURINARY: No dysuria, hematuria.  ENDOCRINE: No polyuria, nocturia,  HEMATOLOGY: No anemia, easy bruising or bleeding SKIN: No rash or lesion. MUSCULOSKELETAL: No joint pain or arthritis.   NEUROLOGIC: No tingling, numbness, weakness.  PSYCHIATRY: No anxiety or depression.   DRUG ALLERGIES:   Allergies  Allergen Reactions  . Carisoprodol Shortness Of Breath  . Other Anaphylaxis and Hives    Sutures: Prolene (blood blisters), silk and catgut (anaphylaxis), Monocril (causes infection and hives).  . Penicillins Anaphylaxis    Has patient had a PCN reaction causing immediate rash, facial/tongue/throat swelling, SOB or lightheadedness with hypotension: Yes Has patient had a PCN reaction causing severe rash involving mucus membranes or skin necrosis: Unknown Has patient had a PCN reaction that required hospitalization: Yes Has patient had a PCN reaction occurring within the last 10 years: No If all of the above answers are  "NO", then may proceed with Cephalosporin use.   . Shellfish Allergy Anaphylaxis  . Sulfamethoxazole Shortness Of Breath  . Tramadol Hcl Shortness Of Breath    Was hospitalized.  . Adhesive [Tape] Other (See Comments)    Adhesives, including surgical glue (Tears skin).  . Codeine Phosphate Itching  . Hydrocodone-Homatropine     Unknown reaction  . Peanut-Containing Drug Products Hives    Can eat peanut containing products, but only has reaction when eating peanuts alone.  Marland Kitchen Zithromax [Azithromycin] Hives    Not allergic to actual medication, but the pink dye in tablet causes hives.    VITALS:  Blood pressure 132/84, pulse 93, temperature 98.6 F (37 C), temperature source Oral, resp. rate 20, height 5\' 4"  (1.626 m), weight (!) 149.7 kg (330 lb), SpO2 97 %.  PHYSICAL EXAMINATION:  GENERAL:  57 y.o.-year-old patient lying in the bed with no acute distress.  EYES: Pupils equal, round, reactive to light and accommodation. No scleral icterus. Extraocular muscles intact.  HEENT: Head atraumatic, normocephalic. Oropharynx and nasopharynx clear.  NECK:  Supple, no jugular venous distention. No thyroid enlargement, no tenderness.  LUNGS: Normal breath sounds bilaterally, no wheezing, rales,rhonchi or crepitation. No use of accessory muscles of respiration.  CARDIOVASCULAR: S1, S2 normal. No murmurs, rubs, or gallops.  ABDOMEN: Soft, nontender, nondistended. Bowel sounds present. No organomegaly or mass.  EXTREMITIES: No pedal edema, cyanosis, or clubbing.  NEUROLOGIC: Cranial nerves II through XII are intact. Muscle strength 5/5 in all extremities. Sensation intact. Gait not checked.  PSYCHIATRIC: The patient is alert and oriented x 3.  SKIN: No obvious  rash, lesion, or ulcer.    LABORATORY PANEL:   CBC Recent Labs  Lab 01/13/18 1005  01/14/18 1451  WBC 10.8  --   --   HGB 14.4   < > 12.6  HCT 42.9   < > 37.8  PLT 269  --   --    < > = values in this interval not displayed.    ------------------------------------------------------------------------------------------------------------------  Chemistries  Recent Labs  Lab 01/13/18 1005  NA 136  K 3.8  CL 103  CO2 26  GLUCOSE 140*  BUN 12  CREATININE 0.63  CALCIUM 9.1  AST 33  ALT 25  ALKPHOS 93  BILITOT 0.8   ------------------------------------------------------------------------------------------------------------------  Cardiac Enzymes No results for input(s): TROPONINI in the last 168 hours. ------------------------------------------------------------------------------------------------------------------  RADIOLOGY:  No results found.  EKG:  No orders found for this or any previous visit.  ASSESSMENT AND PLAN:   57 year old female patient with history of IBS, morbid obesity comes in with left lower quadrant abdominal pain, hematochezia likely infectious colitis/NSAID induced sclerosis, patient is feeling much better on Cipro, Flagyl, IV fluids.  Clinically she is doing better, advance diet to regular diet, likely discharge home tomorrow with 7-day course of Cipro, Flagyl and see gastroenterologist at [facility].  Patient told me that her PCP is trying to get her to see a gastroenterologist there.   2 /depression Diabetes mellitus type 2: 4.  Overactive bladder 5.  Essential hypertension, controlled with amlodipine. 6.  Migraine, Maxalt not available here, patient is on Imitrex in the hospital. Likely discharge home tomorrow.  Patient is agreeable for this plan.     All the records are reviewed and case discussed with Care Management/Social Workerr. Management plans discussed with the patient, family and they are in agreement.  CODE STATUS:full  TOTAL TIME TAKING CARE OF THIS PATIENT: 21minutes.   POSSIBLE D/C IN 1-2 DAYS, DEPENDING ON CLINICAL CONDITION.   Epifanio Lesches M.D on 01/15/2018 at 7:05 PM  Between 7am to 6pm - Pager - [phone]  After 6pm go to  [email or web address] - password [email or web address] [facility]  Office  [phone]  CC: Primary care physician; System, Pcp Not In   Note: This dictation was prepared with Dragon dictation along with smaller phrase technology. Any transcriptional errors that result from this process are unintentional.

## 2018-01-15 NOTE — Care Management Obs Status (Signed)
[facility] NOTIFICATION   Patient Details  Name: Beth [name] MRN: [id number] Date of Birth: 03-Dec-1960   Medicare Observation Status Notification Given:  Yes    Shelbie Ammons, RN 01/15/2018, 3:07 PM

## 2018-01-15 NOTE — Progress Notes (Signed)
PHARMACIST - PHYSICIAN COMMUNICATION  CONCERNING: Antibiotic IV to Oral Route Change Policy  RECOMMENDATION: This patient is receiving metronidazole by the intravenous route.  Based on criteria approved by the Pharmacy and Therapeutics Committee, the antibiotic(s) is/are being converted to the equivalent oral dose form(s).   DESCRIPTION: These criteria include:  Patient being treated for a respiratory tract infection, urinary tract infection, cellulitis or clostridium difficile associated diarrhea if on metronidazole  The patient is not neutropenic and does not exhibit a GI malabsorption state  The patient is eating (either orally or via tube) and/or has been taking other orally administered medications for a least 24 hours  The patient is improving clinically and has a Tmax < 100.5  If you have questions about this conversion, please contact the Pharmacy Department  []   [phone] )  [facility] [x]   [phone] )  [facility] []   [phone] )  [facility] []   [phone] )  [place]

## 2018-01-15 NOTE — Care Management CC44 (Signed)
Condition Code 44 Documentation Completed  Patient Details  Name: Beth [name] MRN: [id number] Date of Birth: [birth year]   Condition Code 44 given:    Patient signature on Condition Code 44 notice:    Documentation of 2 MD's agreement:    Code 44 added to claim:       Shelbie Ammons, RN 01/15/2018, 3:07 PM

## 2018-01-16 DIAGNOSIS — R1032 Left lower quadrant pain: Secondary | ICD-10-CM | POA: Diagnosis not present

## 2018-01-16 LAB — GLUCOSE, CAPILLARY
GLUCOSE-CAPILLARY: 107 mg/dL — AB (ref 65–99)
Glucose-Capillary: 133 mg/dL — ABNORMAL HIGH (ref 65–99)

## 2018-01-16 MED ORDER — ZOLPIDEM TARTRATE 5 MG PO TABS
5.0000 mg | ORAL_TABLET | Freq: Once | ORAL | Status: AC
  Administered 2018-01-16: 5 mg via ORAL
  Filled 2018-01-16: qty 1

## 2018-01-16 MED ORDER — ZOLPIDEM TARTRATE 5 MG PO TABS: 5.0000 mg | ORAL_TABLET | Freq: Every evening | ORAL | Status: DC | PRN

## 2018-01-16 NOTE — Progress Notes (Signed)
Pt transition care home with husband providing transportation. Discharge instructions provided and reviewed with pt and family. Copy of CT and Lab results. Provided per MD verbalization with instruction given to pt. to provided at follow up appointment with PCP in [facility]. No question at time of discharge.

## 2018-01-22 NOTE — Discharge Summary (Signed)
Beth [name], is a 57 y.o. female  DOB 03-18-1961  MRN [id number].  Admission date:  01/13/2018  Admitting Physician  Gorden Harms, MD  Discharge Date:  01/16/2018   Primary MD  System, Pcp Not In  Recommendations for primary care physician for things to follow:   Follow with PCP in 1 week   Admission Diagnosis  Left lower quadrant pain [R10.32] Gastrointestinal hemorrhage, unspecified gastrointestinal hemorrhage type [K92.2]   Discharge Diagnosis  Left lower quadrant pain [R10.32] Gastrointestinal hemorrhage, unspecified gastrointestinal hemorrhage type [K92.2]   Active Problems:   GIB (gastrointestinal bleeding)      Past Medical History:  Diagnosis Date  . Asthma   . Diabetes mellitus without complication ([place])     History reviewed. No pertinent surgical history.     History of present illness and  Hospital Course:     Kindly see H&P for history of present illness and admission details, please review complete Labs, Consult reports and Test reports for all details in brief  HPI  from the history and physical done on the day of admission 57 year old female patient with history of asthma, diabetes mellitus type 2, fibromyalgia came in because of abdominal discomfort,, bright red blood from rectum, GI bleeding. Hospital Course   abdominal pain, acute bright red blood from rectum, possible lower GI bleed, admitted to medical unit, started on IV fluids, IV Protonix, n.p.o., seen by gastroenterology, never required blood transfusion.  Patient actually is visiting from [place] to see her son in [place] in [place].  CT abdomen done in the emergency room showed diverticulosis.  He received IV Cipro, Flagyl, gastroenterology recommended conservative management see if the bleeding resolves.   Stool studies did not show infection.  Discharge home with Cipro and Flagyl for 5 days, patient is given report of CAT scan of abdomen CAD, to show it to her PCP in [place], she told me that her PCP is in the process of referring her to gastroenterologist anyway.  Abdominal pain resolved, patient did tolerated a regular diet and went home in stable condition. #.2 history of fibromyalgia, patient takes Plaquenil, started by her PCP recently and she says it helps her a lot.  Discharge Condition: Stable   Follow UP  Follow-up Information    PCP in [place] Follow up.             Discharge Instructions  and  Discharge Medications      Allergies as of 01/16/2018      Reactions   Carisoprodol Shortness Of Breath   Other Anaphylaxis, Hives   Sutures: Prolene (blood blisters), silk and catgut (anaphylaxis), Monocril (causes infection and hives).   Penicillins Anaphylaxis   Has patient had a PCN reaction causing immediate rash, facial/tongue/throat swelling, SOB or lightheadedness with hypotension: Yes Has patient had a PCN reaction causing severe rash involving mucus membranes or skin necrosis: Unknown Has patient had a PCN reaction that required hospitalization: Yes Has patient had a PCN reaction occurring within the last 10 years: No If all of the above answers are "NO", then may proceed with Cephalosporin use.   Shellfish Allergy Anaphylaxis   Sulfamethoxazole Shortness Of Breath   Tramadol Hcl Shortness Of Breath   Was hospitalized.   Adhesive [tape] Other (See Comments)   Adhesives, including surgical glue (Tears skin).   Codeine Phosphate Itching   Hydrocodone-homatropine    Unknown reaction   Peanut-containing Drug Products Hives   Can eat peanut containing products,  but only has reaction when eating peanuts alone.   Zithromax [azithromycin] Hives   Not allergic to actual medication, but the pink dye in tablet causes hives.      Medication List    TAKE  these medications   albuterol 108 (90 Base) MCG/ACT inhaler Commonly known as:  PROVENTIL HFA;VENTOLIN HFA Inhale 2 puffs into the lungs every 4 (four) hours as needed for wheezing or shortness of breath.   amLODipine 5 MG tablet Commonly known as:  NORVASC Take 5 mg by mouth daily.   cetirizine 10 MG tablet Commonly known as:  ZYRTEC Take 10 mg by mouth daily.   cevimeline 30 MG capsule Commonly known as:  EVOXAC Take 30 mg by mouth 3 (three) times daily.   ciprofloxacin 500 MG tablet Commonly known as:  CIPRO Take 1 tablet (500 mg total) by mouth 2 (two) times daily.   cyclobenzaprine 10 MG tablet Commonly known as:  FLEXERIL Take 10 mg by mouth 3 (three) times daily as needed for muscle spasms.   dicyclomine 10 MG capsule Commonly known as:  BENTYL Take 20 mg by mouth 2 (two) times daily.   fluticasone-salmeterol 115-21 MCG/ACT inhaler Commonly known as:  ADVAIR HFA Inhale 2 puffs into the lungs 2 (two) times daily.   hydroxychloroquine 200 MG tablet Commonly known as:  PLAQUENIL Take 200 mg by mouth daily.   metroNIDAZOLE 500 MG tablet Commonly known as:  FLAGYL Take 1 tablet (500 mg total) by mouth 3 (three) times daily for 14 days.   Milnacipran 50 MG Tabs tablet Commonly known as:  SAVELLA Take 50 mg by mouth 2 (two) times daily.   montelukast 10 MG tablet Commonly known as:  SINGULAIR Take 10 mg by mouth at bedtime.   oxybutynin 15 MG 24 hr tablet Commonly known as:  DITROPAN XL Take 15 mg by mouth daily.   pantoprazole 40 MG tablet Commonly known as:  PROTONIX Take 40 mg by mouth 2 (two) times daily.   pravastatin 40 MG tablet Commonly known as:  PRAVACHOL Take 40 mg by mouth at bedtime.   PROBIOTIC DAILY PO Take 1 capsule by mouth daily.   ranitidine 300 MG tablet Commonly known as:  ZANTAC Take 300 mg by mouth at bedtime.   trazodone 300 MG tablet Commonly known as:  DESYREL Take 300 mg by mouth at bedtime.   TURMERIC PO Take 1,000  mg by mouth 2 (two) times daily.   zolpidem 10 MG tablet Commonly known as:  AMBIEN Take 10 mg by mouth at bedtime.         Diet and Activity recommendation: See Discharge Instructions above   Consults obtained -gastroenterology   Major procedures and Radiology Reports - PLEASE review detailed and final reports for all details, in brief -     Ct Abdomen Pelvis W Contrast  Result Date: 01/13/2018 CLINICAL DATA:  57 year old female with bloody stools and abdominal pain with nausea since last night. Prior cholecystectomy, hysterectomy and hernia repair. Initial encounter. EXAM: CT ABDOMEN AND PELVIS WITH CONTRAST TECHNIQUE: Multidetector CT imaging of the abdomen and pelvis was performed using the standard protocol following bolus administration of intravenous contrast. CONTRAST:  113mL ISOVUE-300 IOPAMIDOL (ISOVUE-300) INJECTION 61% COMPARISON:  None. FINDINGS: Lower chest: Minimal scarring lung bases. Heart size within normal limits. Hepatobiliary: Enlarged fatty liver spanning over 18.3 cm. Tiny right sided calcifications. Post cholecystectomy. No calcified common bile duct stone. Pancreas: No pancreatic mass or inflammation. Spleen: Multiple calcifications throughout top-normal size spleen  consistent with prior granulomatous exposure. Adrenals/Urinary Tract: Extra renal pelvis on the right without calyceal dilation. No obstructing stone. No worrisome renal or adrenal mass. Radiopaque material lower aspect of the bladder/urethra. Patient has a history of urinary incontinence. Question if this is related to prior procedure. Stomach/Bowel: Under distended descending colon and partially fluid-filled sigmoid colon without extraluminal bowel inflammatory process to suggest the presence of diverticulitis. No inflammation surrounds the appendix. No primary gastric or small bowel abnormality noted. Vascular/Lymphatic: Trace calcification abdominal aorta without aneurysm. No large vessel occlusion.  Top-normal size peripancreatic and intra-aortocaval lymph nodes. Reproductive: Post hysterectomy.  No worrisome adnexal mass. Other: Prior periumbilical hernia repair. Mesh extends into the base of fatty containing periumbilical hernia. No bowel containing hernia or free intraperitoneal air. Musculoskeletal: Prior fusion lumbosacral junction with degenerative changes at level just above fusion. IMPRESSION: Under distended descending colon and partially fluid-filled sigmoid colon without extraluminal bowel inflammatory process to suggest the presence of diverticulitis. Prior periumbilical hernia repair. Mesh extends into the base of fatty containing periumbilical hernia. No bowel containing hernia. Enlarged fatty liver spanning over 18.3 cm. Evidence of prior granulomatous exposure as indicated by multiple splenic calcifications and few scattered liver calcifications. Radiopaque material lower aspect of the bladder/urethra. Patient has a history of urinary incontinence. Question if this is related to prior procedure. Prior fusion lumbosacral junction. Degenerative changes at the level just above the fusion. Prior hysterectomy. Top-normal size peripancreatic and intra-aortocaval lymph nodes. Minimal Aortic Atherosclerosis (ICD10-I70.0). Electronically Signed   By: Genia Del M.D.   On: 01/13/2018 11:59    Micro Results    Recent Results (from the past 240 hour(s))  C difficile quick scan w PCR reflex     Status: None   Collection Time: 01/13/18  7:41 PM  Result Value Ref Range Status   C Diff antigen NEGATIVE NEGATIVE Final   C Diff toxin NEGATIVE NEGATIVE Final   C Diff interpretation No C. difficile detected.  Final    Comment: Performed at [facility], [address] [place], [place] [address]  Gastrointestinal Panel by PCR , Stool     Status: None   Collection Time: 01/13/18  7:41 PM  Result Value Ref Range Status   Campylobacter species NOT DETECTED NOT DETECTED Final   Plesimonas  shigelloides NOT DETECTED NOT DETECTED Final   Salmonella species NOT DETECTED NOT DETECTED Final   Yersinia enterocolitica NOT DETECTED NOT DETECTED Final   Vibrio species NOT DETECTED NOT DETECTED Final   Vibrio cholerae NOT DETECTED NOT DETECTED Final   Enteroaggregative E coli (EAEC) NOT DETECTED NOT DETECTED Final   Enteropathogenic E coli (EPEC) NOT DETECTED NOT DETECTED Final   Enterotoxigenic E coli (ETEC) NOT DETECTED NOT DETECTED Final   Shiga like toxin producing E coli (STEC) NOT DETECTED NOT DETECTED Final   Shigella/Enteroinvasive E coli (EIEC) NOT DETECTED NOT DETECTED Final   Cryptosporidium NOT DETECTED NOT DETECTED Final   Cyclospora cayetanensis NOT DETECTED NOT DETECTED Final   Entamoeba histolytica NOT DETECTED NOT DETECTED Final   Giardia lamblia NOT DETECTED NOT DETECTED Final   Adenovirus F40/41 NOT DETECTED NOT DETECTED Final   Astrovirus NOT DETECTED NOT DETECTED Final   Norovirus GI/GII NOT DETECTED NOT DETECTED Final   Rotavirus A NOT DETECTED NOT DETECTED Final   Sapovirus (I, II, IV, and V) NOT DETECTED NOT DETECTED Final    Comment: Performed at [facility] [place], [address] [place], [place] [address]       Today  Subjective:   Ova Wetmore today has no headache,no chest abdominal pain,no new weakness tingling or numbness, feels much better wants to go home today.  Objective:   Blood pressure 129/73, pulse 89, temperature 98 F (36.7 C), temperature source Oral, resp. rate 20, height 5\' 4"  (1.626 m), weight (!) 149.7 kg (330 lb), SpO2 100 %.  No intake or output data in the 24 hours ending 01/22/18 1253  Exam Awake Alert, Oriented x 3, No new F.N deficits, Normal affect [place].AT,PERRAL Supple Neck,No JVD, No cervical lymphadenopathy appriciated.  Symmetrical Chest wall movement, Good air movement bilaterally, CTAB RRR,No Gallops,Rubs or new Murmurs, No Parasternal Heave +ve B.Sounds, Abd Soft, Non tender, No organomegaly  appriciated, No rebound -guarding or rigidity. No Cyanosis, Clubbing or edema, No new Rash or bruise  Data Review   CBC w Diff:  Lab Results  Component Value Date   WBC 10.8 01/13/2018   HGB 12.6 01/14/2018   HCT 37.8 01/14/2018   PLT 269 01/13/2018   LYMPHOPCT 38 09/07/2008   MONOPCT 7 09/07/2008   EOSPCT 4 09/07/2008   BASOPCT 1 09/07/2008    CMP:  Lab Results  Component Value Date   NA 136 01/13/2018   K 3.8 01/13/2018   CL 103 01/13/2018   CO2 26 01/13/2018   BUN 12 01/13/2018   CREATININE 0.63 01/13/2018   PROT 8.3 (H) 01/13/2018   ALBUMIN 3.8 01/13/2018   BILITOT 0.8 01/13/2018   ALKPHOS 93 01/13/2018   AST 33 01/13/2018   ALT 25 01/13/2018  .   Total Time in preparing paper work, data evaluation and todays exam - 35 minutes  Epifanio Lesches M.D on 01/16/2018 at 12:53 PM    Note: This dictation was prepared with Dragon dictation along with smaller phrase technology. Any transcriptional errors that result from this process are unintentional.

## 2019-02-02 IMAGING — CT CT ABD-PELV W/ CM
2 of 5 series · 16 of 46 positions shown, 18 images · IV contrast (iopamidol)
Comparison: None.

CLINICAL DATA: 56-year-old female with bloody stools and abdominal
pain with nausea since last night. Prior cholecystectomy,
hysterectomy and hernia repair. Initial encounter.

EXAM:
CT ABDOMEN AND PELVIS WITH CONTRAST
TECHNIQUE: Multidetector CT imaging of the abdomen and pelvis was performed
using the standard protocol following bolus administration of
intravenous contrast.
CONTRAST:  100mL ZI7PMW-366 IOPAMIDOL (ZI7PMW-366) INJECTION 61%

[Series 2: axial st · axial · 0.86mm/px · z∈[-1314,-890]mm · 13 of 97 slices shown, 15 images]
[im 6/97  soft-tissue]
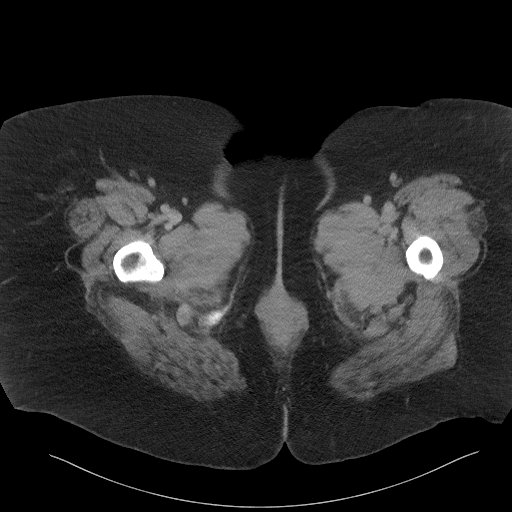
[im 6/97  bone]
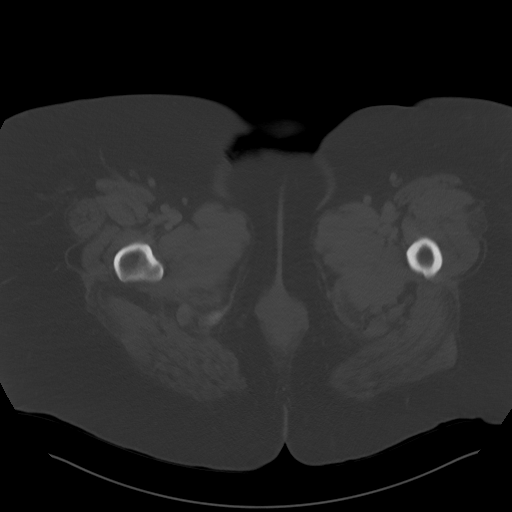
[im 11/97  soft-tissue]
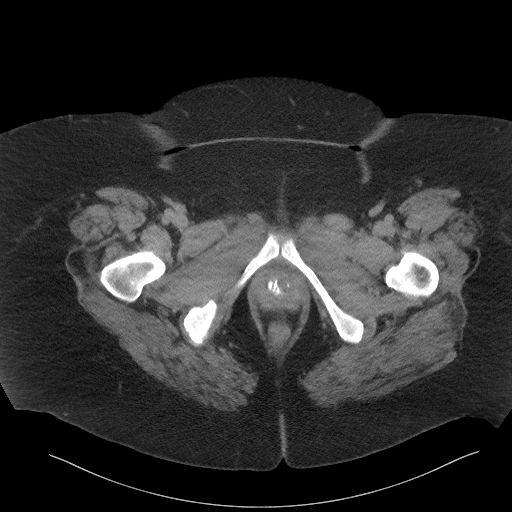
[im 22/97  soft-tissue]
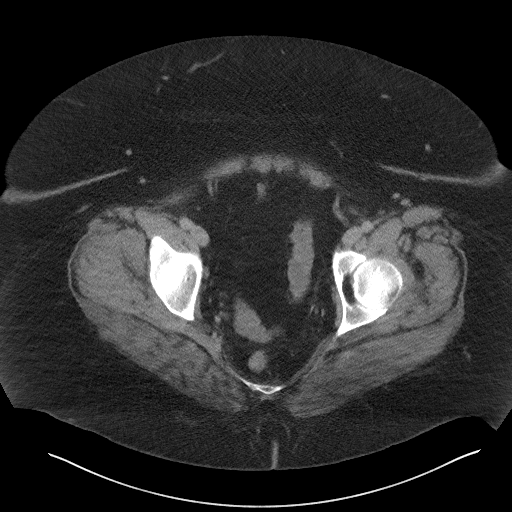
[im 27/97  soft-tissue]
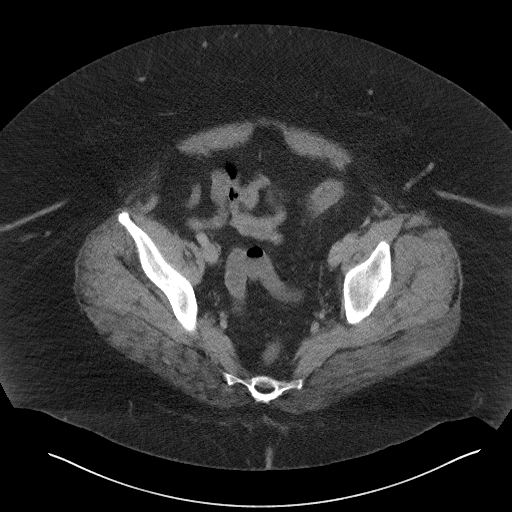
[im 33/97  soft-tissue]
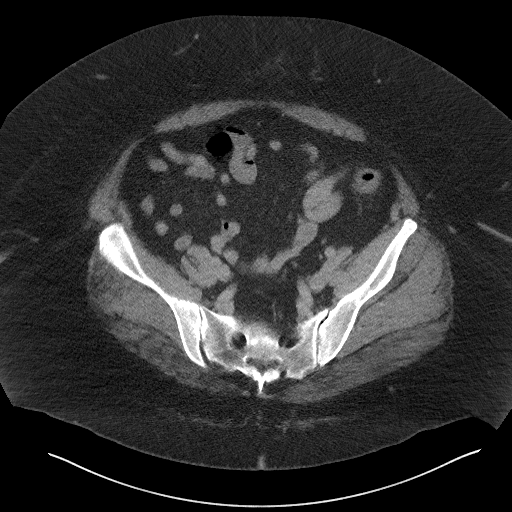
[im 43/97  soft-tissue]
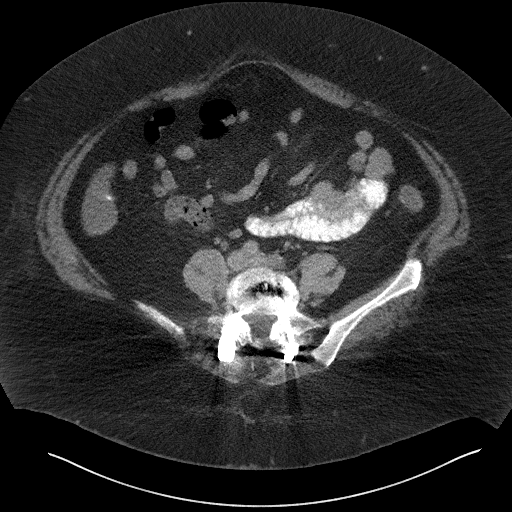
[im 49/97  soft-tissue]
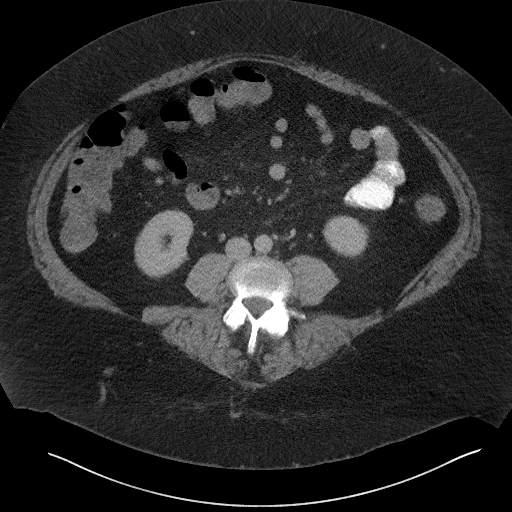
[im 54/97  soft-tissue]
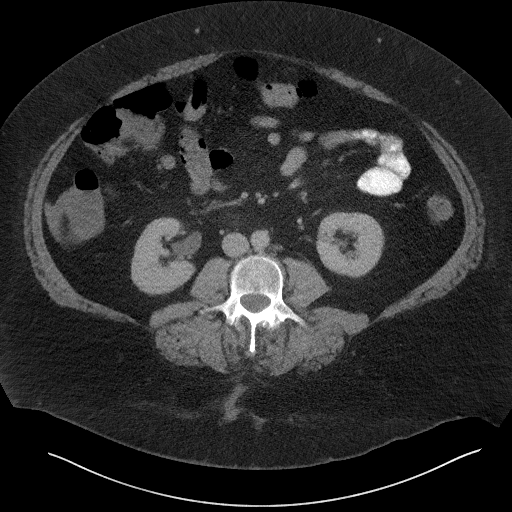
[im 65/97  soft-tissue]
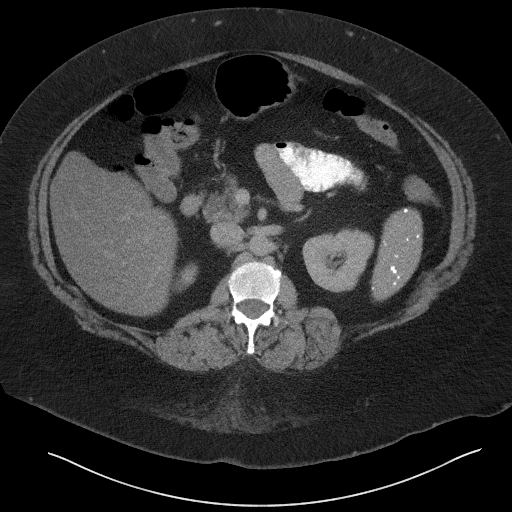
[im 65/97  bone]
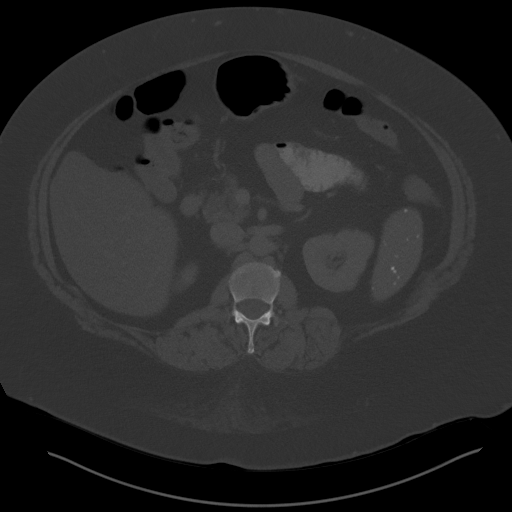
[im 70/97  soft-tissue]
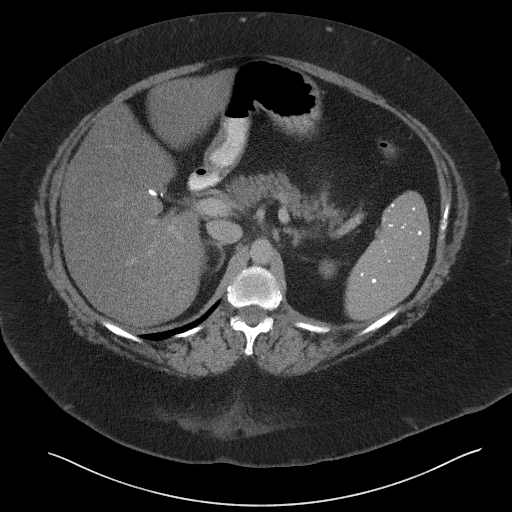
[im 75/97  soft-tissue]
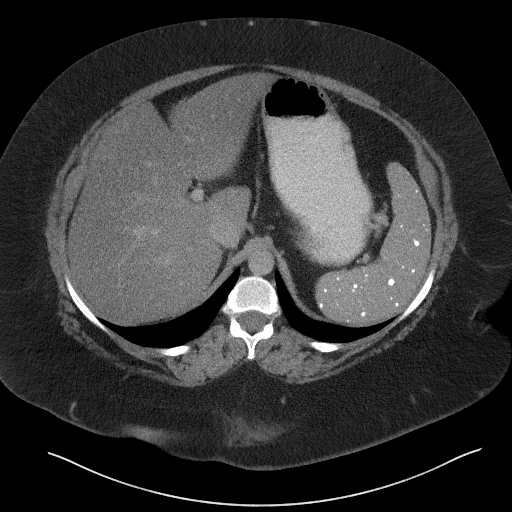
[im 86/97  soft-tissue]
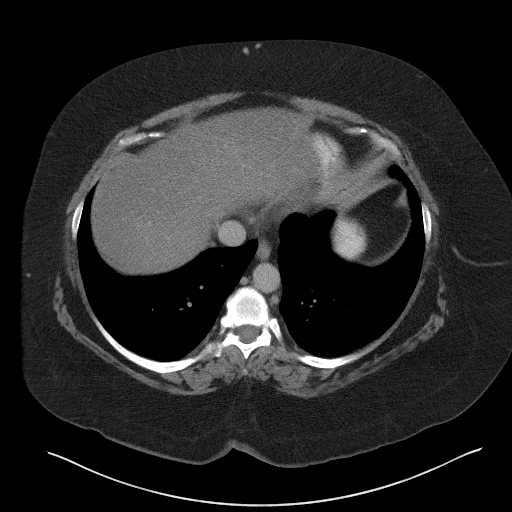
[im 91/97  soft-tissue]
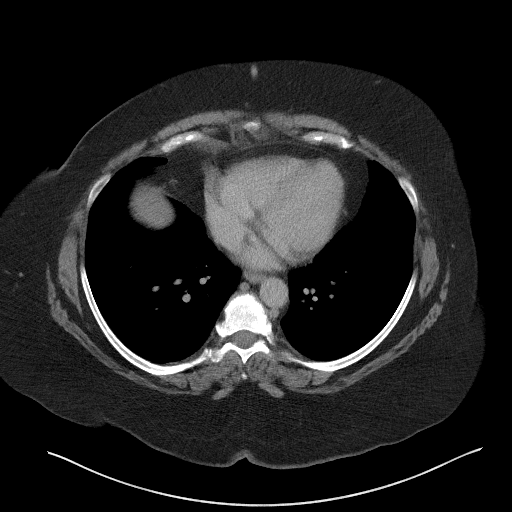

[Series 6: coronal st · coronal · 0.92mm/px · 3 of 122 slices shown]
[im 41/122  soft-tissue]
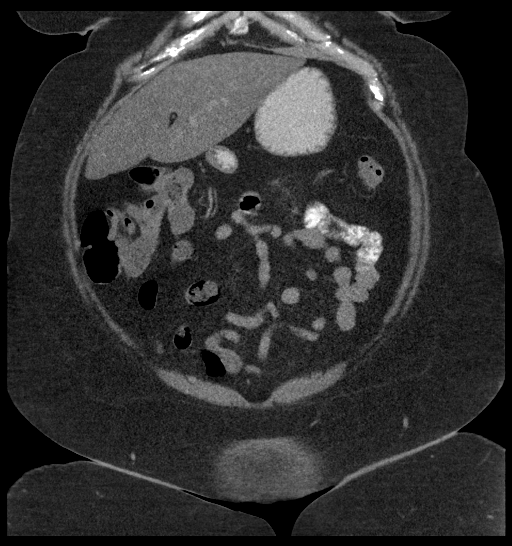
[im 54/122  soft-tissue]
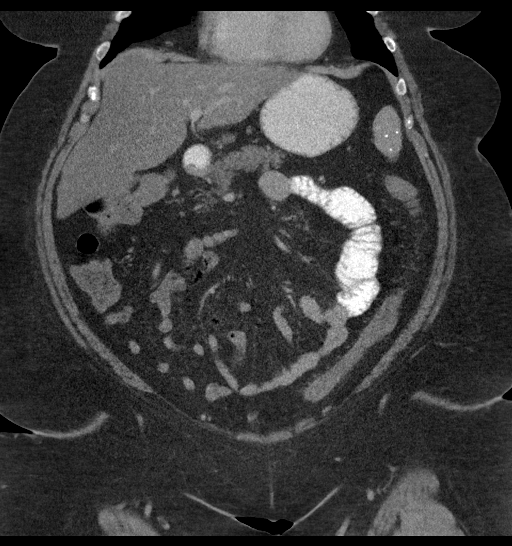
[im 68/122  soft-tissue]
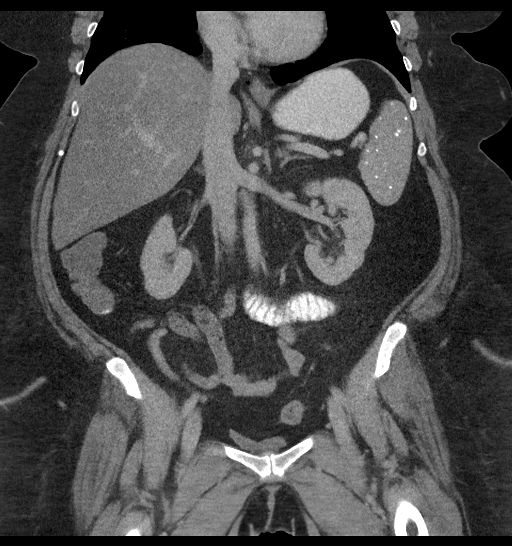

[16 of 46 positions shown; findings below may reference images not displayed]

FINDINGS: Lower chest: Minimal scarring lung bases. Heart size within normal
limits.

Hepatobiliary: Enlarged fatty liver spanning over 18.3 cm. Tiny
right sided calcifications. Post cholecystectomy. No calcified
common bile duct stone.

Pancreas: No pancreatic mass or inflammation.

Spleen: Multiple calcifications throughout top-normal size spleen
consistent with prior granulomatous exposure.

Adrenals/Urinary Tract: Extra renal pelvis on the right without
calyceal dilation. No obstructing stone. No worrisome renal or
adrenal mass.

Radiopaque material lower aspect of the bladder/urethra. Patient has
a history of urinary incontinence. Question if this is related to
prior procedure.

Stomach/Bowel: Under distended descending colon and partially
fluid-filled sigmoid colon without extraluminal bowel inflammatory
process to suggest the presence of diverticulitis.

No inflammation surrounds the appendix.

No primary gastric or small bowel abnormality noted.

Vascular/Lymphatic: Trace calcification abdominal aorta without
aneurysm. No large vessel occlusion.

Top-normal size peripancreatic and intra-aortocaval lymph nodes.

Reproductive: Post hysterectomy.  No worrisome adnexal mass.

Other: Prior periumbilical hernia repair. Mesh extends into the base
of fatty containing periumbilical hernia. No bowel containing hernia
or free intraperitoneal air.

Musculoskeletal: Prior fusion lumbosacral junction with degenerative
changes at level just above fusion.
IMPRESSION: Under distended descending colon and partially fluid-filled sigmoid
colon without extraluminal bowel inflammatory process to suggest the
presence of diverticulitis.

Prior periumbilical hernia repair. Mesh extends into the base of
fatty containing periumbilical hernia. No bowel containing hernia.

Enlarged fatty liver spanning over 18.3 cm.

Evidence of prior granulomatous exposure as indicated by multiple
splenic calcifications and few scattered liver calcifications.

Radiopaque material lower aspect of the bladder/urethra. Patient has
a history of urinary incontinence. Question if this is related to
prior procedure.

Prior fusion lumbosacral junction. Degenerative changes at the level
just above the fusion.

Prior hysterectomy.

Top-normal size peripancreatic and intra-aortocaval lymph nodes.

Minimal Aortic Atherosclerosis ([id number]).

## 2020-08-07 DEATH — deceased
